# Patient Record
Sex: Female | Born: 1955 | Race: Black or African American | Hispanic: No | Smoking: Never smoker
Health system: Southern US, Community
[De-identification: ages and names within clinical notes are randomized; demographics above are authoritative.]

## PROBLEM LIST (undated history)

## (undated) DIAGNOSIS — F329 Major depressive disorder, single episode, unspecified: Secondary | ICD-10-CM

## (undated) DIAGNOSIS — E559 Vitamin D deficiency, unspecified: Secondary | ICD-10-CM

## (undated) DIAGNOSIS — I10 Essential (primary) hypertension: Secondary | ICD-10-CM

## (undated) DIAGNOSIS — B029 Zoster without complications: Secondary | ICD-10-CM

## (undated) DIAGNOSIS — N200 Calculus of kidney: Secondary | ICD-10-CM

## (undated) DIAGNOSIS — F32A Depression, unspecified: Secondary | ICD-10-CM

## (undated) DIAGNOSIS — E669 Obesity, unspecified: Secondary | ICD-10-CM

## (undated) HISTORY — PX: LITHOTRIPSY: SUR834

## (undated) HISTORY — DX: Zoster without complications: B02.9

## (undated) HISTORY — DX: Essential (primary) hypertension: I10

## (undated) HISTORY — DX: Obesity, unspecified: E66.9

## (undated) HISTORY — DX: Calculus of kidney: N20.0

## (undated) HISTORY — DX: Vitamin D deficiency, unspecified: E55.9

## (undated) HISTORY — DX: Major depressive disorder, single episode, unspecified: F32.9

## (undated) HISTORY — PX: EAR CYST EXCISION: SHX22

## (undated) HISTORY — PX: ABDOMINAL HYSTERECTOMY: SHX81

## (undated) HISTORY — DX: Depression, unspecified: F32.A

---

## 1998-09-26 ENCOUNTER — Other Ambulatory Visit: Admission: RE | Admit: 1998-09-26 | Discharge: 1998-09-26 | Payer: Self-pay | Admitting: *Deleted

## 1999-02-06 ENCOUNTER — Ambulatory Visit (HOSPITAL_COMMUNITY): Admission: RE | Admit: 1999-02-06 | Discharge: 1999-02-06 | Payer: Self-pay | Admitting: *Deleted

## 2000-04-30 ENCOUNTER — Ambulatory Visit (HOSPITAL_COMMUNITY): Admission: RE | Admit: 2000-04-30 | Discharge: 2000-04-30 | Payer: Self-pay | Admitting: *Deleted

## 2001-04-03 ENCOUNTER — Encounter: Payer: Self-pay | Admitting: Internal Medicine

## 2001-04-03 ENCOUNTER — Encounter: Admission: RE | Admit: 2001-04-03 | Discharge: 2001-04-03 | Payer: Self-pay | Admitting: Internal Medicine

## 2001-08-10 ENCOUNTER — Encounter: Payer: Self-pay | Admitting: Internal Medicine

## 2001-08-10 ENCOUNTER — Encounter: Admission: RE | Admit: 2001-08-10 | Discharge: 2001-08-10 | Payer: Self-pay | Admitting: Internal Medicine

## 2001-11-05 ENCOUNTER — Other Ambulatory Visit: Admission: RE | Admit: 2001-11-05 | Discharge: 2001-11-05 | Payer: Self-pay | Admitting: Internal Medicine

## 2002-04-07 ENCOUNTER — Encounter: Admission: RE | Admit: 2002-04-07 | Discharge: 2002-04-07 | Payer: Self-pay | Admitting: Internal Medicine

## 2002-04-07 ENCOUNTER — Encounter: Payer: Self-pay | Admitting: Internal Medicine

## 2002-11-05 ENCOUNTER — Other Ambulatory Visit: Admission: RE | Admit: 2002-11-05 | Discharge: 2002-11-05 | Payer: Self-pay | Admitting: Internal Medicine

## 2003-11-09 ENCOUNTER — Other Ambulatory Visit: Admission: RE | Admit: 2003-11-09 | Discharge: 2003-11-09 | Payer: Self-pay | Admitting: Internal Medicine

## 2003-11-15 ENCOUNTER — Ambulatory Visit (HOSPITAL_COMMUNITY): Admission: RE | Admit: 2003-11-15 | Discharge: 2003-11-15 | Payer: Self-pay | Admitting: Internal Medicine

## 2004-02-28 ENCOUNTER — Encounter (INDEPENDENT_AMBULATORY_CARE_PROVIDER_SITE_OTHER): Payer: Self-pay | Admitting: Specialist

## 2004-02-28 ENCOUNTER — Ambulatory Visit (HOSPITAL_COMMUNITY): Admission: RE | Admit: 2004-02-28 | Discharge: 2004-02-28 | Payer: Self-pay | Admitting: Obstetrics and Gynecology

## 2004-12-03 ENCOUNTER — Ambulatory Visit (HOSPITAL_COMMUNITY): Admission: RE | Admit: 2004-12-03 | Discharge: 2004-12-03 | Payer: Self-pay | Admitting: Internal Medicine

## 2005-01-23 ENCOUNTER — Other Ambulatory Visit: Admission: RE | Admit: 2005-01-23 | Discharge: 2005-01-23 | Payer: Self-pay | Admitting: Obstetrics and Gynecology

## 2006-10-02 ENCOUNTER — Other Ambulatory Visit: Admission: RE | Admit: 2006-10-02 | Discharge: 2006-10-02 | Payer: Self-pay | Admitting: Internal Medicine

## 2007-10-13 ENCOUNTER — Ambulatory Visit (HOSPITAL_COMMUNITY): Admission: RE | Admit: 2007-10-13 | Discharge: 2007-10-13 | Payer: Self-pay | Admitting: Internal Medicine

## 2008-04-12 ENCOUNTER — Ambulatory Visit (HOSPITAL_COMMUNITY): Admission: RE | Admit: 2008-04-12 | Discharge: 2008-04-12 | Payer: Self-pay | Admitting: Internal Medicine

## 2008-06-16 ENCOUNTER — Other Ambulatory Visit: Admission: RE | Admit: 2008-06-16 | Discharge: 2008-06-16 | Payer: Self-pay | Admitting: Obstetrics and Gynecology

## 2008-08-02 ENCOUNTER — Encounter: Payer: Self-pay | Admitting: Obstetrics and Gynecology

## 2008-08-02 ENCOUNTER — Ambulatory Visit (HOSPITAL_COMMUNITY): Admission: RE | Admit: 2008-08-02 | Discharge: 2008-08-02 | Payer: Self-pay | Admitting: Obstetrics and Gynecology

## 2009-10-03 ENCOUNTER — Encounter: Payer: Self-pay | Admitting: Obstetrics and Gynecology

## 2009-10-03 ENCOUNTER — Ambulatory Visit (HOSPITAL_COMMUNITY): Admission: RE | Admit: 2009-10-03 | Discharge: 2009-10-04 | Payer: Self-pay | Admitting: Obstetrics and Gynecology

## 2009-10-09 ENCOUNTER — Ambulatory Visit (HOSPITAL_COMMUNITY): Admission: RE | Admit: 2009-10-09 | Discharge: 2009-10-09 | Payer: Self-pay | Admitting: Obstetrics and Gynecology

## 2009-10-24 ENCOUNTER — Ambulatory Visit (HOSPITAL_COMMUNITY): Admission: RE | Admit: 2009-10-24 | Discharge: 2009-10-24 | Payer: Self-pay | Admitting: Internal Medicine

## 2009-11-07 IMAGING — RF DG CYSTOGRAM 3+V
12 series · 13 of 13 positions shown · non-contrast
Comparison: None.

CLINICAL DATA: 53-year-old female status post repair of the dome of
the bladder recently.  The patient presents with an indwelling
Foley catheter present since the surgery.

CYSTOGRAM
TECHNIQUE: After catheterization of the urinary bladder following
sterile technique the bladder was filled with 200 cc Cysto-Hypaque
30% by drip infusion.  Serial spot images were obtained during
bladder filling and post draining.
Fluoroscopy Time: 3.2 minutes

[Series 1: run · 1 of 1 slices shown (1 of 12)]
[im 1/1]
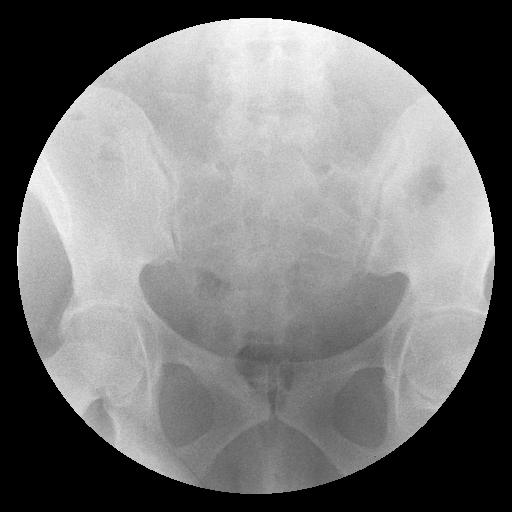

[Series 2: run · 1 of 1 slices shown (2 of 12)]
[im 1/1]
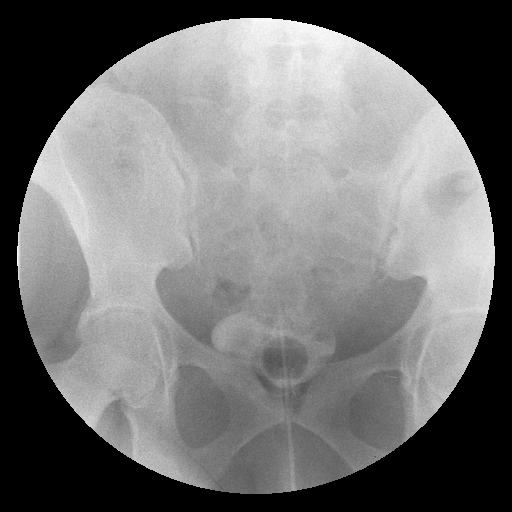

[Series 3: run · 1 of 1 slices shown (3 of 12)]
[im 1/1]
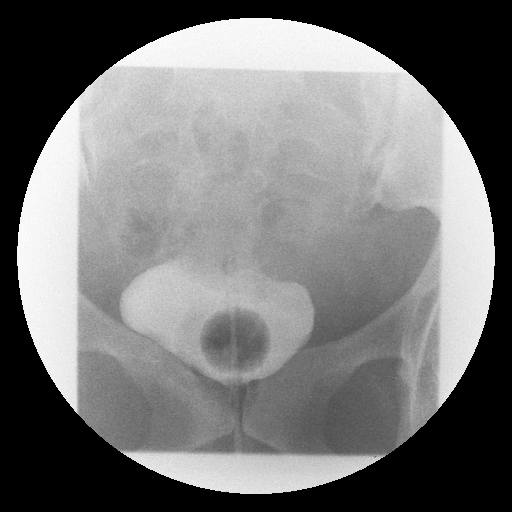

[Series 4: run · 1 of 1 slices shown (4 of 12)]
[im 1/1]
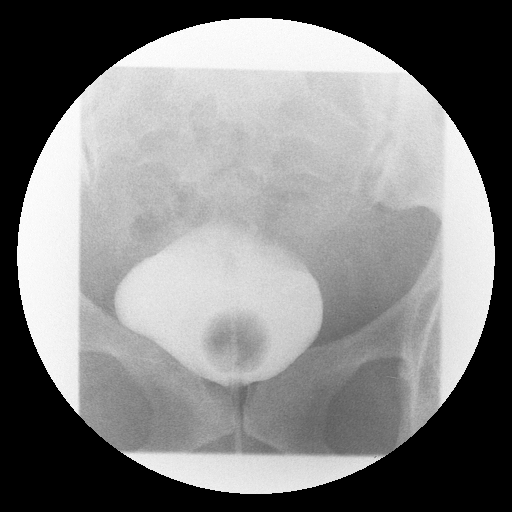

[Series 5: run · 1 of 1 slices shown (5 of 12)]
[im 1/1]
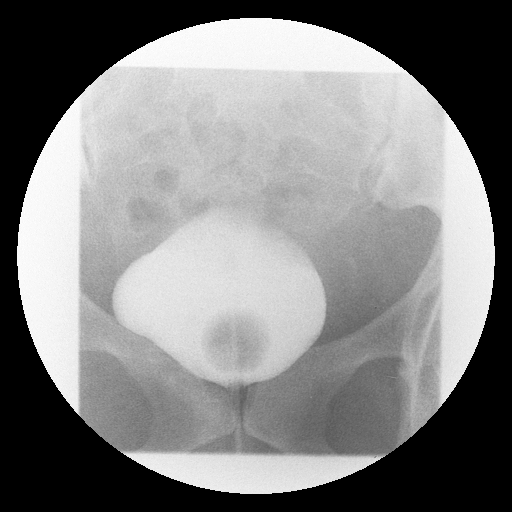

[Series 6: run · 1 of 1 slices shown (6 of 12)]
[im 1/1]
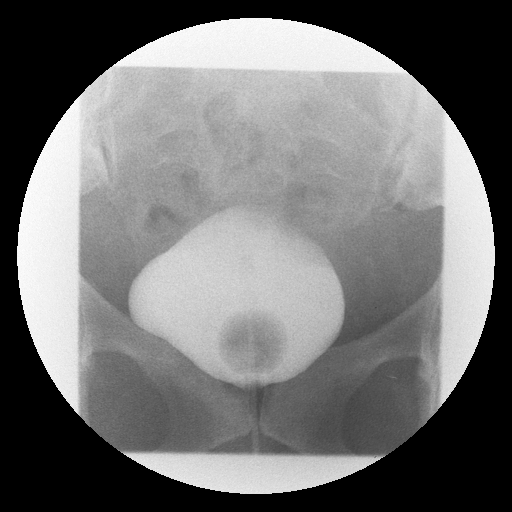

[Series 7: run · 1 of 1 slices shown (7 of 12)]
[im 1/1]
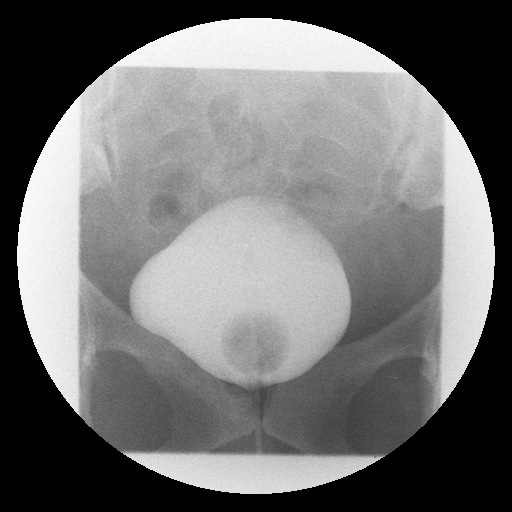

[Series 8: run · 1 of 1 slices shown (8 of 12)]
[im 1/1]
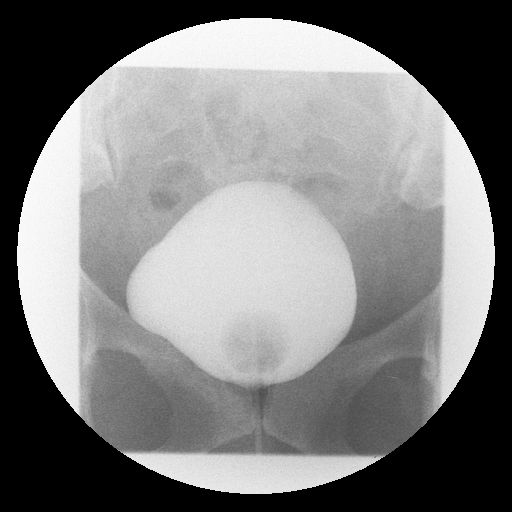

[Series 9: run · 2 of 2 slices shown (9 of 12)]
[im 1/2]
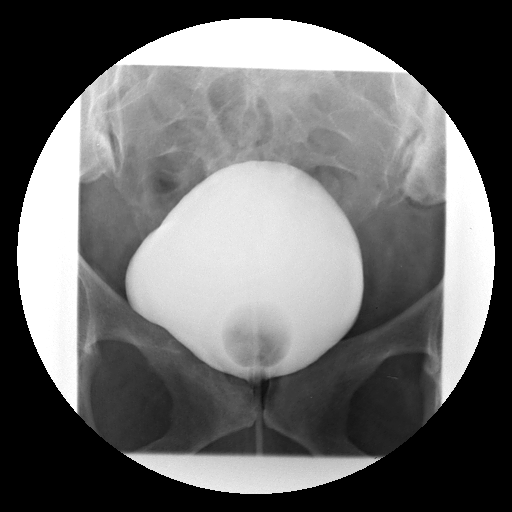
[im 2/2]
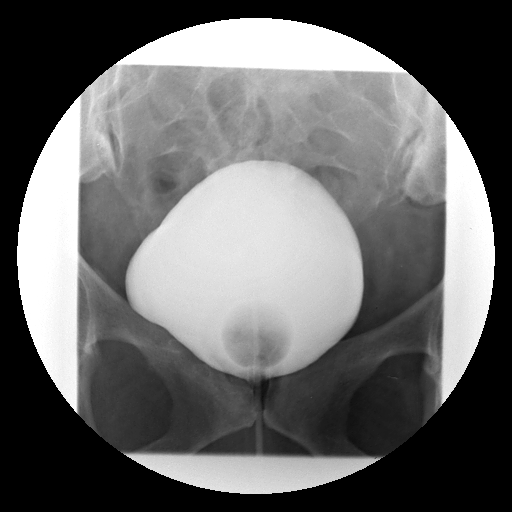

[Series 10: run · 1 of 1 slices shown (10 of 12)]
[im 1/1]
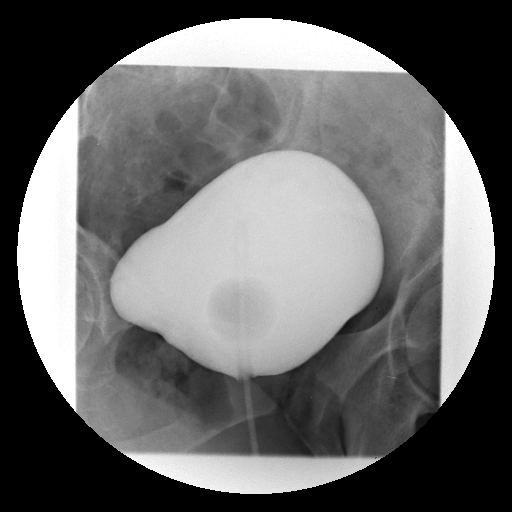

[Series 11: run · 1 of 1 slices shown (11 of 12)]
[im 1/1]
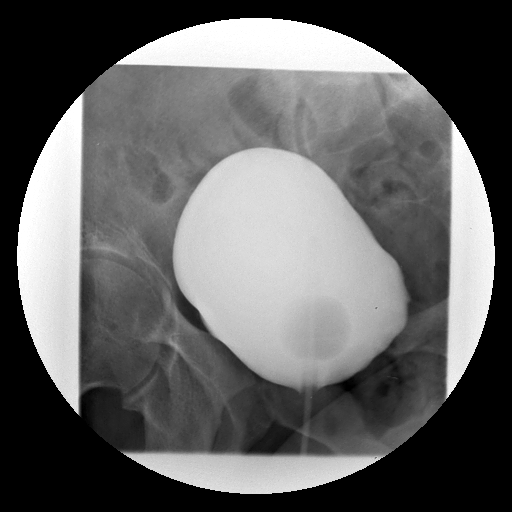

[Series 12: run · 1 of 1 slices shown (12 of 12)]
[im 1/1]
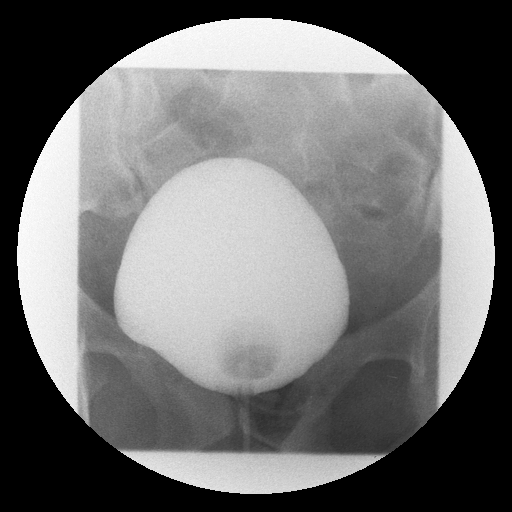

[13 of 13 positions shown; findings below may reference images not displayed]

FINDINGS: Preprocedural scout view of the pelvis is unremarkable.
Contrast was instilled into the patient's bladder via her existing
Foley catheter using gravity.  After 200 ml of contrast, fairly
good bladder distention was achieved and the patient was fairly
uncomfortable.  Bladder contour including the dome is within normal
limits throughout the exam.  No extravasation of contrast or
irregularity is identified.

Contrast was then drained via gravity through the Foley catheter.
There is minimal residual in the right lower bladder.  Again, no
evidence of extravasation.
IMPRESSION: Negative cystogram.

I discussed the above findings with Dr. Ynes Hartsock by telephone
at 1188 hours on 10/09/2009.

## 2011-04-04 LAB — COMPREHENSIVE METABOLIC PANEL
Albumin: 3.4 g/dL — ABNORMAL LOW (ref 3.5–5.2)
CO2: 32 mEq/L (ref 19–32)
Calcium: 9.4 mg/dL (ref 8.4–10.5)
Chloride: 104 mEq/L (ref 96–112)
GFR calc Af Amer: >60 mL/min (ref >60)
GFR calc non Af Amer: >60 mL/min (ref >60)
Potassium: 4 mEq/L (ref 3.5–5.1)
Sodium: 141 mEq/L (ref 135–145)

## 2011-04-04 LAB — CBC
HCT: 36.5 % (ref 36.0–46.0)
Hemoglobin: 11.2 g/dL — ABNORMAL LOW (ref 12.0–15.0)
MCHC: 33.1 g/dL (ref 30.0–36.0)
MCV: 82.5 fL (ref 78.0–100.0)
Platelets: 271 10*3/uL (ref 150–400)
Platelets: 305 10*3/uL (ref 150–400)
RBC: 4.04 MIL/uL (ref 3.87–5.11)
RBC: 4.38 MIL/uL (ref 3.87–5.11)
RDW: 13.7 % (ref 11.5–15.5)
RDW: 13.9 % (ref 11.5–15.5)
WBC: 11.4 10*3/uL — ABNORMAL HIGH (ref 4.0–10.5)
WBC: 13.6 10*3/uL — ABNORMAL HIGH (ref 4.0–10.5)

## 2011-04-04 LAB — TYPE AND SCREEN: Antibody Screen: NEGATIVE

## 2011-05-14 NOTE — Op Note (Signed)
Heather Harvey, Heather Harvey                 ACCOUNT NO.:  [id number]   MEDICAL RECORD NO.:  [id number]          PATIENT TYPE:  AMB   LOCATION:  [facility]                           FACILITY:  [facility]   PHYSICIAN:  Cynthia P. Romine, M.D.DATE OF BIRTH:  1956/05/20   DATE OF PROCEDURE:  08/02/2008  DATE OF DISCHARGE:                               OPERATIVE REPORT   PREOPERATIVE DIAGNOSES:  1. Metrorrhagia with a 1 x 1-cm sessile polyp on sonohysterogram.  2. History of simple hyperplasia.   POSTOPERATIVE DIAGNOSIS:  1. Metrorrhagia with a 1 x 1-cm sessile polyp on sonohysterogram.  2. History of simple hyperplasia.   PATHOLOGY:  Pending.   PROCEDURE:  Hysteroscopic resection of endometrial polyp and D&C.   SURGEON:  Cynthia P. Romine, MD   ANESTHESIA:  General by LMA.   ESTIMATED BLOOD LOSS:  Minimal.   COMPLICATIONS:  None.   SORBITOL DEFICIT:  75 mL.   PROCEDURE:  The patient was taken to the operating room and after the  induction of adequate general anesthesia, she was placed in a dorsal  lithotomy position and prepped and draped in usual fashion.  The bladder  was drained with a red rubber catheter.  Posterior weighted and anterior  Sims retractor were placed.  The cervix was grasped on its anterior lip  with a single-tooth tenaculum.  The cervix was noted to descend quite  significantly, in fact passed at the level of the introitus with  traction by the tenaculum.  The cervix was very easily dilated to a #31  Pratt with minimal resistance.  The resectoscope was introduced.  Sorbitol was used as a distention medium with the pressure set at 80  mmHg.  Hysteroscopy was carried out.  A small polyp at the fundus was  seen and some fluffy endometrium.  These were resected with a single  loop.  Photographic documentation was taken of the fundus and the  endometrial cavity.  The scope was withdrawn.  Gentle sharp curettage  was carried out.  The specimen was sent together to pathology.  Instruments removed from vagina and the procedure was terminated.  The  patient tolerated it well and went in satisfactory condition to  postanesthesia recovery.      Cynthia P. Romine, M.D.  Electronically Signed     CPR/MEDQ  D:  08/02/2008  T:  08/03/2008  Job:  [id number]

## 2011-05-17 NOTE — Op Note (Signed)
[name], Heather                           ACCOUNT NO.:  [id number]   MEDICAL RECORD NO.:  [id number]                   PATIENT TYPE:  AMB   LOCATION:  [facility]                                  FACILITY:  [facility]   PHYSICIAN:  Laqueta Linden, M.D.                 DATE OF BIRTH:  08/22/1956   DATE OF PROCEDURE:  02/28/2004  DATE OF DISCHARGE:                                 OPERATIVE REPORT   PREOPERATIVE DIAGNOSES:  1. Abnormal uterine bleeding due to endometrial polyp versus hyperplasia.  2. 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   POSTOPERATIVE DIAGNOSES:  1. Abnormal uterine bleeding due to endometrial polyp versus hyperplasia.  2. 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   PROCEDURES:  1. Hysteroscopic resection with curettage.  2. Right salpingo-oophorectomy, laparoscopic.   SURGEON:  Laqueta Linden, M.D.   ASSISTANT:  Assistant for procedure #2 was Edwena Felty. Romine, M.D.   ANESTHESIA:  General endotracheal anesthesia.   ESTIMATED BLOOD LOSS:  Less than 50 mL.   SPECIMENS:  Endometrial resection and curettings.  Right tube and ovary,  cytologic washings.   COUNTS:  Correct.   COMPLICATIONS:  None.   INDICATIONS FOR PROCEDURE:  Heather Harvey is a 55 year old nulligravid black  female with intramenstrual bleeding and a sessile polyp versus hyperplasia  or thickening on ultrasound and sonohistogram.  She had an incidental  finding of a 4 x 3.3 cm cyst involving the right ovary which was felt to  have classic radiologic findings for a dermoid.  She desires most  conservative outpatient procedure possible and therefore will undergo  combined hysteroscopic resection followed by laparoscopic right salpingo-  oophorectomy.  She has seen both informed consent films and voiced her  understanding and acceptance of all risks, benefits, and alternatives and  complications including, but not limited to anesthesia risks, infection,  bleeding possibly  requiring transfusion; injury to bowel, bladder, ureters,  vessels, nerves; possible need for laparotomy immediately or after surgery  for complications and possible need for an overnight or longer stay in the  hospital as well as risks of DVT, PE, pneumonia and other unnamed risks.  She has seen a film, voiced her understanding and acceptance of all risks  and agrees to proceed.  CA125 preoperatively was normal at 14.   DESCRIPTION OF PROCEDURE:  The patient was taken to the operating room and  after proper identification and consents are ascertained, she was placed on  the operating table in supine position.  After the induction of general  endotracheal anesthesia, she was placed in the Rutherford stirrups and abdomen,  perineum and vagina were prepped and draped in routine sterile fashion.  A  transurethral Foley was placed which was removed at the conclusion of the  procedure.  Bimanual examination confirmed a mid plane to retroverted,  slightly enlarged uterus which was mobile.  Speculum was  placed in the  vagina and the cervix grasped with a single-tooth tenaculum.  The uterine  cavity sounded to 8 cm.  the internal os was dilated to a #33 Pratt dilator.  Resectoscope was then inserted under direct vision.  The endocervical canal  was free of lesions.  Both tubal ostia were visualized.  There was a broad  based polyp on the posterior left wall of the uterine cavity.  There were no  other focal lesions.  The resectoscope was placed on routine setting and the  polyp was then resected using the single loop.  Specimens were sent  separately to pathology.  Several small bleeding points were cauterized.  The resectoscope was then removed.  Sharp curettage, productive of an  additional moderate amount of tissue was also then performed and the  specimen was sent separately to pathology.  All instruments were removed.  The tenaculum site was hemostatic. A Hulka tenaculum was then placed on the   posterior lip of the cervix and attention was then turned abdominally.  The  surgeon regowned and regloved  and a 2 cm infraumbilical incision was then  made.  A Veress needle was inserted into the peritoneal cavity with  intraperitoneal placement confirmed by saline drop and saline installation  tests.  Pneumoperitoneum was then established with CO2 with close monitoring  of pressures at approximately three liters.  The Veress needle  was removed.  The #10-11 disposable trocar was then inserted into the peritoneal cavity  without difficulty.  Inspection upon insertion of the laparoscope revealed  no obvious injury or bleeding at the insertion site.  The liver edge  appeared smooth.  Gallbladder was distended.  The appendix was not  visualized.  The patient was placed in Trendelenburg position and the uterus  elevated with a tenaculum.  The left tube and ovary appeared completely  normal.  The right ovary was enlarged to approximately 4 cm with a smooth  cyst which was consistent with a dermoid on ultrasound.  This was freely  mobile.  The uterus was freely mobile and the cul-de-sac was free of  lesions.  There was no evidence of adhesions or endometriosis noted.  There  were two additional ports placed, a 5 mm in the left lower quadrant under  direct vision and a 10 mm trochar in the right lower quadrant again under  direct vision after transillumination of vessels.  The adnexa were grasped  and retracted laterally and the tripolar cautery was then used to cauterize  and cut the proximal fallopian tube and utero-ovarian ligament with freeing  up of the right tube and ovary complex.  The ureter was visualized deep in  the pelvis well out of the operative field.  Once the infundibulopelvic  ligament had been isolated, 2-0 Vicryl Endoloops were then placed across the  infundibulopelvic ligament and the specimen was then excised from the blood supply.  An Endo sac was then placed through the  larger port and the tube  and ovary were dropped into this sac.  It was felt imperative to not disrupt  the sac and to remove the ovary intact to avoid peritoneal irritation from  spillage of cyst contents.  For this reason, prior to removing the sac, the  fascial incision of the right lower quadrant incision, the fascial incision  was then enlarged to approximately 3.5 cm under direct vision.  The sac with  intact ovary and cyst was then removed easily.  The fascial edges were  grasped  and the fascia was then closed in a running fashion using 0 Vicryl  suture.  The subcutaneous hemostasis was ascertained with cautery and the  skin was closed with interrupted subcuticular sutures of 4-0 Vicryl.  Inspection of the incision after replacing the pneumoperitoneum revealed  slight amount of bleeding from the peritoneal and preperitoneal fat edges.  This was cauterized with excellent hemostasis noted.  The pneumoperitoneum  was allowed to escape and several minutes elapsed.  It was then  reestablished and this incision site remained dry.  The pedicle remained  hemostatic.  There were no other lesions identified.  Of note, prior to  initiating the RSO, lactated Ringer's was instilled and aspirated and sent  in heparin for cytologic washings.  At this point, the 5 mm trocar was  withdrawn with excellent hemostasis noted.  Pneumoperitoneum was allowed to  escape and the central trocar was then removed.  There was some bleeding  from the umbilical incision which responded to continuous pressure while  closing the other incisions.  The other incision was closed with a  subcuticular suture of 4-0 Vicryl.  Steri-Strips were applied and the  incisions were injected with a total of 20 mL of 0.5% plain Marcaine for the  three incisions.  Pressure dressings were applied.  The umbilical incision  was observed after removal of pressure and remained hemostatic.  That  incision was then closed with Steri-Strips  and Marcaine applied.  Pressure  dressings were applied to all.  The patient received 30 mg of Toradol IV, 30  mg IM intraoperatively.  The Foley catheter and Hulka tenaculum were  removed.  She was stable and extubated on transfer to the recovery room.  She will be observed and discharged per anesthesia protocol.  She is to take  her routine medications, Advil or Aleve as needed, and given a  prescription for Percocet 5/325, dispense 20 one to two q.4-6h. p.r.n. pain  with no refills.  She is to follow up in the office in two to three weeks'  time or sooner for excessive pain, fever, bleeding or other concerns.  She  was given routine verbal and written discharge instructions.                                               Laqueta Linden, M.D.    LKS/MEDQ  D:  02/28/2004  T:  02/28/2004  Job:  [id number]   cc:   Lovenia Kim, D.O.  [address]  [place]  [place] [address]  Fax: [phone]

## 2011-08-20 ENCOUNTER — Ambulatory Visit
Admission: RE | Admit: 2011-08-20 | Discharge: 2011-08-20 | Disposition: A | Discharge status: Home / Self Care | Payer: Managed Care, Other (non HMO) | Source: Ambulatory Visit | Attending: Internal Medicine | Admitting: Internal Medicine

## 2011-08-20 ENCOUNTER — Other Ambulatory Visit: Payer: Self-pay | Admitting: Internal Medicine

## 2011-08-20 DIAGNOSIS — R059 Cough, unspecified: Secondary | ICD-10-CM

## 2011-08-20 DIAGNOSIS — R05 Cough: Secondary | ICD-10-CM

## 2011-09-27 LAB — COMPREHENSIVE METABOLIC PANEL
ALT: 16
AST: 19
Albumin: 3.4 — ABNORMAL LOW
BUN: 8
Calcium: 9.4
Chloride: 103
Creatinine, Ser: 0.79
Total Bilirubin: 0.6
Total Protein: 7

## 2011-09-27 LAB — CBC: MCHC: 33.1

## 2011-09-27 LAB — HCG, SERUM, QUALITATIVE: Preg, Serum: NEGATIVE

## 2011-11-30 DIAGNOSIS — N200 Calculus of kidney: Secondary | ICD-10-CM

## 2011-11-30 HISTORY — DX: Calculus of kidney: N20.0

## 2012-01-08 ENCOUNTER — Ambulatory Visit (HOSPITAL_COMMUNITY)
Admission: RE | Admit: 2012-01-08 | Discharge: 2012-01-08 | Disposition: A | Discharge status: Home / Self Care | Payer: Managed Care, Other (non HMO) | Source: Ambulatory Visit | Attending: Internal Medicine | Admitting: Internal Medicine

## 2012-01-08 ENCOUNTER — Other Ambulatory Visit (HOSPITAL_COMMUNITY): Payer: Self-pay | Admitting: Internal Medicine

## 2012-01-08 DIAGNOSIS — R05 Cough: Secondary | ICD-10-CM | POA: Insufficient documentation

## 2012-01-08 DIAGNOSIS — R059 Cough, unspecified: Secondary | ICD-10-CM

## 2012-01-08 DIAGNOSIS — R0602 Shortness of breath: Secondary | ICD-10-CM | POA: Insufficient documentation

## 2012-07-15 ENCOUNTER — Other Ambulatory Visit (HOSPITAL_COMMUNITY): Payer: Self-pay | Admitting: Emergency Medicine

## 2012-07-15 DIAGNOSIS — Z1231 Encounter for screening mammogram for malignant neoplasm of breast: Secondary | ICD-10-CM

## 2012-08-17 ENCOUNTER — Ambulatory Visit (HOSPITAL_COMMUNITY)
Admission: RE | Admit: 2012-08-17 | Discharge: 2012-08-17 | Disposition: A | Discharge status: Home / Self Care | Payer: Managed Care, Other (non HMO) | Source: Ambulatory Visit | Attending: Emergency Medicine | Admitting: Emergency Medicine

## 2012-08-17 DIAGNOSIS — Z1231 Encounter for screening mammogram for malignant neoplasm of breast: Secondary | ICD-10-CM | POA: Insufficient documentation

## 2013-03-31 ENCOUNTER — Encounter: Payer: Self-pay | Admitting: Internal Medicine

## 2013-05-26 ENCOUNTER — Encounter: Payer: Managed Care, Other (non HMO) | Admitting: Internal Medicine

## 2013-12-27 ENCOUNTER — Other Ambulatory Visit: Payer: Self-pay | Admitting: Physician Assistant

## 2013-12-27 MED ORDER — TRIAMTERENE-HCTZ 37.5-25 MG PO TABS: 1.0000 | ORAL_TABLET | Freq: Every day | ORAL | Status: DC

## 2014-02-09 ENCOUNTER — Ambulatory Visit: Payer: Self-pay | Admitting: Emergency Medicine

## 2014-02-10 ENCOUNTER — Ambulatory Visit
Admission: RE | Admit: 2014-02-10 | Discharge: 2014-02-10 | Disposition: A | Discharge status: Home / Self Care | Payer: Managed Care, Other (non HMO) | Source: Ambulatory Visit | Attending: Emergency Medicine | Admitting: Emergency Medicine

## 2014-02-10 ENCOUNTER — Encounter: Payer: Self-pay | Admitting: Emergency Medicine

## 2014-02-10 ENCOUNTER — Ambulatory Visit (INDEPENDENT_AMBULATORY_CARE_PROVIDER_SITE_OTHER): Payer: Managed Care, Other (non HMO) | Admitting: Emergency Medicine

## 2014-02-10 VITALS — BP 126/84 | HR 68 | Temp 98.2°F | Resp 18 | Ht 67.25 in | Wt 215.0 lb

## 2014-02-10 DIAGNOSIS — R5381 Other malaise: Secondary | ICD-10-CM

## 2014-02-10 DIAGNOSIS — F329 Major depressive disorder, single episode, unspecified: Secondary | ICD-10-CM

## 2014-02-10 DIAGNOSIS — M25511 Pain in right shoulder: Secondary | ICD-10-CM

## 2014-02-10 DIAGNOSIS — R7309 Other abnormal glucose: Secondary | ICD-10-CM

## 2014-02-10 DIAGNOSIS — F32A Depression, unspecified: Secondary | ICD-10-CM

## 2014-02-10 DIAGNOSIS — D649 Anemia, unspecified: Secondary | ICD-10-CM

## 2014-02-10 DIAGNOSIS — Z79899 Other long term (current) drug therapy: Secondary | ICD-10-CM

## 2014-02-10 DIAGNOSIS — I10 Essential (primary) hypertension: Secondary | ICD-10-CM

## 2014-02-10 DIAGNOSIS — M542 Cervicalgia: Secondary | ICD-10-CM

## 2014-02-10 DIAGNOSIS — E559 Vitamin D deficiency, unspecified: Secondary | ICD-10-CM

## 2014-02-10 DIAGNOSIS — R5383 Other fatigue: Secondary | ICD-10-CM

## 2014-02-10 MED ORDER — PREDNISONE 10 MG PO TABS: ORAL_TABLET | ORAL | Status: DC

## 2014-02-10 NOTE — Progress Notes (Signed)
Subjective:    Patient ID: Heather Harvey, female    DOB: 1956/09/24, 58 y.o.   MRN: [id number]  HPI Comments: [age] yo female presents for 3 month F/U for HTN, Pre-Dm, D. Deficient. She has been trying to improve diet. She is exercising routinely. She is trying to lose weight. LAST LABS were WNL except BS only mildly elevated. Her BP is good at home.  She has been mildly fatigued but notes she has been very busy with work. She feels she is sleeping well and fatigue resolves with rest.   She has increased discomfort in neck and right arm x 10 days. No recall of injury or strain. She has been exercising routinely but no new exercises. She notes she feels throbbing ache, denies numbness. She notes increased sharp pain with sudden movements. She does note she has had more computer use at work. She notes her bra makes symptoms worse. She is concerned her large breasts are chronically straining her neck and shoulders. She notes lying flat helps with the pain/ pulling sensation due to taking away the weight of her breasts. She wants to consider breast reduction surgery. She has tried better bras with wider padded straps without any relief with symptoms. She notes she has had shoulder indentions from bra support for years.  Shoulder Pain   Back Pain  Neck Pain    Current Outpatient Prescriptions on File Prior to Visit  Medication Sig Dispense Refill  . Magnesium 500 MG CAPS Take 1 capsule by mouth daily.      . metformin (FORTAMET) 500 MG (OSM) 24 hr tablet Take 500 mg by mouth daily with breakfast.      . Multiple Vitamin (MULTIVITAMIN) tablet Take 1 tablet by mouth daily.      Marland Kitchen. triamterene-hydrochlorothiazide (MAXZIDE-25) 37.5-25 MG per tablet Take 1 tablet by mouth daily.  90 tablet  0   No current facility-administered medications on file prior to visit.   No Known Allergies Past Medical History  Diagnosis Date  . Hypertension   . Depression   . Shingles     chest-right  . Obesity   .  Kidney stones 11-2011  . Vitamin D deficiency        Review of Systems  Constitutional: Positive for fatigue.  Musculoskeletal: Positive for arthralgias, back pain, joint swelling and neck pain.  All other systems reviewed and are negative.   BP 126/84  Pulse 68  Temp(Src) 98.2 F (36.8 C) (Temporal)  Resp 18  Ht 5' 7.25" (1.708 m)  Wt 215 lb (97.523 kg)  BMI 33.43 kg/m2     Objective:   Physical Exam  Nursing note and vitals reviewed. Constitutional: She is oriented to person, place, and time. She appears well-developed and well-nourished. No distress.  HENT:  Head: Normocephalic and atraumatic.  Right Ear: External ear normal.  Left Ear: External ear normal.  Nose: Nose normal.  Mouth/Throat: Oropharynx is clear and moist.  Eyes: Conjunctivae and EOM are normal.  Neck: Normal range of motion. Neck supple. No JVD present. No thyromegaly present.  Cardiovascular: Normal rate, regular rhythm, normal heart sounds and intact distal pulses.   Pulmonary/Chest: Effort normal and breath sounds normal.  Abdominal: Soft. Bowel sounds are normal. She exhibits no distension and no mass. There is no tenderness. There is no rebound and no guarding.  Genitourinary:  LARGE Breasts, with good support with wide padded straps, wearing right strap off of shoulder due to pain  Musculoskeletal: Normal range of motion.  She exhibits no edema and no tenderness.  Both shoulders with almost 1 inch indention from bra support of large breasts. + pain with ROM right Shoulder and neck + decreased strength R>L shoulder  Lymphadenopathy:    She has no cervical adenopathy.  Neurological: She is alert and oriented to person, place, and time. No cranial nerve deficit.  Skin: Skin is warm and dry. No rash noted. No erythema. No pallor.  Psychiatric: She has a normal mood and affect. Her behavior is normal. Judgment and thought content normal.          Assessment & Plan:  1.  3 month F/U for HTN,  Pre-Dm, D. Deficient. Needs healthy diet, cardio QD and obtain healthy weight. Check Labs, Check BP if >130/80 call office 2. Fatigue- check labs, increase activity and H2O 3. Right shoulder/ neck pain- check labs, get xrays, heat stretch ice. Concern that Breast size may be contributing to pain. Patient wants to consider having breast reduction. ADvised she can schedule OV with plastic surgery for further evaluation.

## 2014-02-10 NOTE — Patient Instructions (Signed)

## 2014-02-11 ENCOUNTER — Other Ambulatory Visit: Payer: Self-pay | Admitting: Emergency Medicine

## 2014-02-11 DIAGNOSIS — F325 Major depressive disorder, single episode, in full remission: Secondary | ICD-10-CM | POA: Insufficient documentation

## 2014-02-11 DIAGNOSIS — E559 Vitamin D deficiency, unspecified: Secondary | ICD-10-CM | POA: Insufficient documentation

## 2014-02-11 DIAGNOSIS — I10 Essential (primary) hypertension: Secondary | ICD-10-CM | POA: Insufficient documentation

## 2014-02-11 LAB — BASIC METABOLIC PANEL WITH GFR
BUN: 18 mg/dL (ref 6–23)
CO2: 30 meq/L (ref 19–32)
CREATININE: 0.86 mg/dL (ref 0.50–1.10)
Calcium: 9.8 mg/dL (ref 8.4–10.5)
Chloride: 103 mEq/L (ref 96–112)
GFR, Est African American: 87 mL/min
GFR, Est Non African American: 75 mL/min
GLUCOSE: 75 mg/dL (ref 70–99)
Potassium: 3.9 mEq/L (ref 3.5–5.3)
SODIUM: 141 meq/L (ref 135–145)

## 2014-02-11 LAB — CBC WITH DIFFERENTIAL/PLATELET
BASOS ABS: 0 10*3/uL (ref 0.0–0.1)
BASOS PCT: 0 % (ref 0–1)
EOS ABS: 0.2 10*3/uL (ref 0.0–0.7)
EOS PCT: 3 % (ref 0–5)
HCT: 38.2 % (ref 36.0–46.0)
HEMOGLOBIN: 12.9 g/dL (ref 12.0–15.0)
LYMPHS PCT: 45 % (ref 12–46)
Lymphs Abs: 3.9 10*3/uL (ref 0.7–4.0)
MCH: 27.2 pg (ref 26.0–34.0)
MCHC: 33.8 g/dL (ref 30.0–36.0)
MCV: 80.6 fL (ref 78.0–100.0)
MONO ABS: 0.7 10*3/uL (ref 0.1–1.0)
MONOS PCT: 8 % (ref 3–12)
NEUTROS PCT: 44 % (ref 43–77)
Neutro Abs: 3.9 10*3/uL (ref 1.7–7.7)
PLATELETS: 306 10*3/uL (ref 150–400)
RBC: 4.74 MIL/uL (ref 3.87–5.11)
RDW: 14.5 % (ref 11.5–15.5)
WBC: 8.7 10*3/uL (ref 4.0–10.5)

## 2014-02-11 LAB — VITAMIN D 25 HYDROXY (VIT D DEFICIENCY, FRACTURES): Vit D, 25-Hydroxy: 42 ng/mL (ref 30–89)

## 2014-02-11 LAB — TSH: TSH: 2.572 u[IU]/mL (ref 0.350–4.500)

## 2014-02-11 LAB — VITAMIN B12: Vitamin B-12: 458 pg/mL (ref 211–911)

## 2014-02-11 LAB — HEMOGLOBIN A1C
Hgb A1c MFr Bld: 5.4 % (ref <5.7)
Mean Plasma Glucose: 108 mg/dL (ref <117)

## 2014-02-11 LAB — MAGNESIUM: MAGNESIUM: 2.1 mg/dL (ref 1.5–2.5)

## 2014-02-11 LAB — IRON AND TIBC
%SAT: 16 % — AB (ref 20–55)
IRON: 57 ug/dL (ref 42–145)
TIBC: 353 ug/dL (ref 250–470)
UIBC: 296 ug/dL (ref 125–400)

## 2014-02-11 LAB — FOLATE RBC: RBC Folate: 605 ng/mL (ref >280)

## 2014-02-11 LAB — INSULIN, FASTING: INSULIN FASTING, SERUM: 24 u[IU]/mL (ref 3–28)

## 2014-02-11 MED ORDER — CYCLOBENZAPRINE HCL 10 MG PO TABS: 10.0000 mg | ORAL_TABLET | Freq: Three times a day (TID) | ORAL | Status: DC | PRN

## 2014-02-21 ENCOUNTER — Telehealth: Payer: Self-pay | Admitting: *Deleted

## 2014-02-21 NOTE — Telephone Encounter (Signed)
Pt has finished medication for arm pain & has had no improvement asking for advise on what should be done next?   #2 = pt said her boss told her she might consider getting a order faxed to her for the Team that handles a eval, of her work station to see if there is a reason that maybe her work station is causing the problem? She said they eval everything like her chair the height of the chair ect.  If ok send order to Faylene Kurtzanesha Smith fax# [phone] [facility] [place]

## 2014-02-23 ENCOUNTER — Other Ambulatory Visit: Payer: Self-pay | Admitting: *Deleted

## 2014-02-23 DIAGNOSIS — M25529 Pain in unspecified elbow: Secondary | ICD-10-CM

## 2014-02-23 NOTE — Telephone Encounter (Signed)
Pt calling because shoulder is not better please advise

## 2014-04-06 ENCOUNTER — Emergency Department (HOSPITAL_BASED_OUTPATIENT_CLINIC_OR_DEPARTMENT_OTHER): Payer: Managed Care, Other (non HMO)

## 2014-04-06 ENCOUNTER — Encounter (HOSPITAL_BASED_OUTPATIENT_CLINIC_OR_DEPARTMENT_OTHER): Payer: Self-pay | Admitting: Emergency Medicine

## 2014-04-06 ENCOUNTER — Emergency Department (HOSPITAL_BASED_OUTPATIENT_CLINIC_OR_DEPARTMENT_OTHER)
Admission: EM | Admit: 2014-04-06 | Discharge: 2014-04-06 | Disposition: A | Discharge status: Home / Self Care | Payer: Managed Care, Other (non HMO) | Attending: Emergency Medicine | Admitting: Emergency Medicine

## 2014-04-06 DIAGNOSIS — R112 Nausea with vomiting, unspecified: Secondary | ICD-10-CM | POA: Insufficient documentation

## 2014-04-06 DIAGNOSIS — Z79899 Other long term (current) drug therapy: Secondary | ICD-10-CM | POA: Insufficient documentation

## 2014-04-06 DIAGNOSIS — Z8659 Personal history of other mental and behavioral disorders: Secondary | ICD-10-CM | POA: Insufficient documentation

## 2014-04-06 DIAGNOSIS — Z8619 Personal history of other infectious and parasitic diseases: Secondary | ICD-10-CM | POA: Insufficient documentation

## 2014-04-06 DIAGNOSIS — Z9089 Acquired absence of other organs: Secondary | ICD-10-CM | POA: Insufficient documentation

## 2014-04-06 DIAGNOSIS — E669 Obesity, unspecified: Secondary | ICD-10-CM | POA: Insufficient documentation

## 2014-04-06 DIAGNOSIS — I10 Essential (primary) hypertension: Secondary | ICD-10-CM | POA: Insufficient documentation

## 2014-04-06 DIAGNOSIS — N200 Calculus of kidney: Secondary | ICD-10-CM

## 2014-04-06 LAB — URINE MICROSCOPIC-ADD ON

## 2014-04-06 LAB — URINALYSIS, ROUTINE W REFLEX MICROSCOPIC
BILIRUBIN URINE: NEGATIVE
Glucose, UA: NEGATIVE mg/dL
KETONES UR: NEGATIVE mg/dL
Leukocytes, UA: NEGATIVE
NITRITE: NEGATIVE
PH: 6 (ref 5.0–8.0)
Protein, ur: NEGATIVE mg/dL
Specific Gravity, Urine: 1.017 (ref 1.005–1.030)
Urobilinogen, UA: 0.2 mg/dL (ref 0.0–1.0)

## 2014-04-06 MED ORDER — ONDANSETRON HCL 4 MG/2ML IJ SOLN
4.0000 mg | Freq: Once | INTRAMUSCULAR | Status: AC
  Administered 2014-04-06: 4 mg via INTRAVENOUS
  Filled 2014-04-06: qty 2

## 2014-04-06 MED ORDER — ONDANSETRON HCL 4 MG/2ML IJ SOLN
4.0000 mg | Freq: Once | INTRAMUSCULAR | Status: DC
  Filled 2014-04-06: qty 2

## 2014-04-06 MED ORDER — ONDANSETRON HCL 4 MG/2ML IJ SOLN
4.0000 mg | Freq: Once | INTRAMUSCULAR | Status: AC
  Administered 2014-04-06: 4 mg via INTRAVENOUS

## 2014-04-06 MED ORDER — HYDROMORPHONE HCL PF 1 MG/ML IJ SOLN
1.0000 mg | Freq: Once | INTRAMUSCULAR | Status: AC
  Administered 2014-04-06: 1 mg via INTRAVENOUS
  Filled 2014-04-06: qty 1

## 2014-04-06 NOTE — ED Notes (Signed)
Left flank pain that started last night with urinary frequency worsening today.

## 2014-04-06 NOTE — ED Provider Notes (Signed)
CSN: [id number]     Arrival date & time 04/06/14  1120 History   First MD Initiated Contact with Patient 04/06/14 1205     Chief Complaint  Patient presents with  . Flank Pain     (Consider location/radiation/quality/duration/timing/severity/associated sxs/prior Treatment) Patient is a 58 y.o. female presenting with flank pain. The history is provided by the patient. No language interpreter was used.  Flank Pain This is a new problem. The current episode started today. The problem occurs constantly. The problem has been gradually worsening. Associated symptoms include nausea and vomiting. Nothing aggravates the symptoms. She has tried nothing for the symptoms. The treatment provided no relief.  Pt reports she has a history of shingles.    Past Medical History  Diagnosis Date  . Hypertension   . Depression   . Shingles     chest-right  . Obesity   . Kidney stones 11-2011  . Vitamin D deficiency    Past Surgical History  Procedure Laterality Date  . Lithotripsy    . Ear cyst excision Left   . Abdominal hysterectomy      partial   Family History  Problem Relation Age of Onset  . Hypertension Mother   . Cancer Mother     breast  . Stroke Father   . Diabetes Father   . Hyperlipidemia Father    History  Substance Use Topics  . Smoking status: Passive Smoke Exposure - Never Smoker  . Smokeless tobacco: Not on file  . Alcohol Use: No   OB History   Grav Para Term Preterm Abortions TAB SAB Ect Mult Living                 Review of Systems  Gastrointestinal: Positive for nausea and vomiting.  Genitourinary: Positive for flank pain.  All other systems reviewed and are negative.     Allergies  Review of patient's allergies indicates no known allergies.  Home Medications   Current Outpatient Rx  Name  Route  Sig  Dispense  Refill  . cyclobenzaprine (FLEXERIL) 10 MG tablet   Oral   Take 1 tablet (10 mg total) by mouth every 8 (eight) hours as needed for muscle  spasms. Take 1/2 to 1 by mouth as needed for muscle spams every 8 hours.   30 tablet   1   . Magnesium 500 MG CAPS   Oral   Take 1 capsule by mouth daily.         . metformin (FORTAMET) 500 MG (OSM) 24 hr tablet   Oral   Take 500 mg by mouth daily with breakfast.         . Multiple Vitamin (MULTIVITAMIN) tablet   Oral   Take 1 tablet by mouth daily.         . predniSONE (DELTASONE) 10 MG tablet      1 po TID x 3 days, 1 PO BID x 3 days, 1 po QD x 5 days   20 tablet   0   . triamterene-hydrochlorothiazide (MAXZIDE-25) 37.5-25 MG per tablet   Oral   Take 1 tablet by mouth daily.   90 tablet   0    BP 196/113  Pulse 84  Temp(Src) 97.7 F (36.5 C)  Resp 24  SpO2 100% Physical Exam  Nursing note and vitals reviewed. Constitutional: She is oriented to person, place, and time. She appears well-developed and well-nourished.  HENT:  Head: Normocephalic and atraumatic.  Eyes: EOM are normal. Pupils are equal,  round, and reactive to light.  Neck: Normal range of motion.  Cardiovascular: Normal rate and normal heart sounds.   Pulmonary/Chest: Effort normal.  Abdominal: She exhibits no distension.  Musculoskeletal: Normal range of motion.  Neurological: She is alert and oriented to person, place, and time.  Skin: Skin is warm.  Psychiatric: She has a normal mood and affect.    ED Course  Procedures (including critical care time) Labs Review Labs Reviewed  URINALYSIS, ROUTINE W REFLEX MICROSCOPIC - Abnormal; Notable for the following:    Hgb urine dipstick MODERATE (*)    All other components within normal limits  URINE MICROSCOPIC-ADD ON   Imaging Review Ct Abdomen Pelvis Wo Contrast  04/06/2014   CLINICAL DATA:  Left flank pain and nausea with history of kidney stones.  EXAM: CT ABDOMEN AND PELVIS WITHOUT CONTRAST  TECHNIQUE: Multidetector CT imaging of the abdomen and pelvis was performed following the standard protocol without intravenous contrast.   COMPARISON:  None.  FINDINGS: Lung bases show minimal dependent atelectasis bilaterally. Heart size normal. No pericardial or pleural effusion.  1.6 cm low-attenuation lesion in left hepatic lobe is difficult to definitively characterize without IV contrast but in the absence of known malignancy, cysts is likely. Liver, gallbladder and adrenal glands are otherwise unremarkable. Stones are seen in the kidneys bilaterally. Left renal edema and mild to moderate left hydronephrosis secondary to an 8 mm stone at the left ureteral pelvic junction. Left ureter is decompressed. Spleen, pancreas, stomach and bowel are unremarkable with exception of a possible tiny hiatal hernia. Hysterectomy. Ovaries are visualized. 10 mm left external iliac lymph node, nonspecific. No free fluid. No worrisome lytic or sclerotic lesions. Degenerative changes are seen in the spine.  IMPRESSION: 1. Mild to moderately obstructing 8 mm left ureteral pelvic junction stone. 2. Bilateral renal stones.   Electronically Signed   By: Leanna BattlesMelinda  Blietz M.D.   On: 04/06/2014 13:04     EKG Interpretation None      MDM   Final diagnoses:  Kidney stone    Urine mod blood,   Pt reports some pain relief with dilaudid and zofran.   Pt has a 8mm left upj stone.   I spoke to Dr. Michel Bickersnewsome's office. ([place]   Dr. Adriana Simasook will see pt at 3pm and evaluate.  Pt given 2nd dosage of pain medications.   Ct disc sent with pt.     Lonia SkinnerLeslie K IonaSofia, PA-C 04/06/14 1444

## 2014-04-06 NOTE — Discharge Instructions (Signed)
Kidney Stones  Kidney stones (urolithiasis) are deposits that form inside your kidneys. The intense pain is caused by the stone moving through the urinary tract. When the stone moves, the ureter goes into spasm around the stone. The stone is usually passed in the urine.   CAUSES   · A disorder that makes certain neck glands produce too much parathyroid hormone (primary hyperparathyroidism).  · A buildup of uric acid crystals, similar to gout in your joints.  · Narrowing (stricture) of the ureter.  · A kidney obstruction present at birth (congenital obstruction).  · Previous surgery on the kidney or ureters.  · Numerous kidney infections.  SYMPTOMS   · Feeling sick to your stomach (nauseous).  · Throwing up (vomiting).  · Blood in the urine (hematuria).  · Pain that usually spreads (radiates) to the groin.  · Frequency or urgency of urination.  DIAGNOSIS   · Taking a history and physical exam.  · Blood or urine tests.  · CT scan.  · Occasionally, an examination of the inside of the urinary bladder (cystoscopy) is performed.  TREATMENT   · Observation.  · Increasing your fluid intake.  · Extracorporeal shock wave lithotripsy This is a noninvasive procedure that uses shock waves to break up kidney stones.  · Surgery may be needed if you have severe pain or persistent obstruction. There are various surgical procedures. Most of the procedures are performed with the use of small instruments. Only small incisions are needed to accommodate these instruments, so recovery time is minimized.  The size, location, and chemical composition are all important variables that will determine the proper choice of action for you. Talk to your health care provider to better understand your situation so that you will minimize the risk of injury to yourself and your kidney.   HOME CARE INSTRUCTIONS   · Drink enough water and fluids to keep your urine clear or pale yellow. This will help you to pass the stone or stone fragments.  · Strain  all urine through the provided strainer. Keep all particulate matter and stones for your health care provider to see. The stone causing the pain may be as small as a grain of salt. It is very important to use the strainer each and every time you pass your urine. The collection of your stone will allow your health care provider to analyze it and verify that a stone has actually passed. The stone analysis will often identify what you can do to reduce the incidence of recurrences.  · Only take over-the-counter or prescription medicines for pain, discomfort, or fever as directed by your health care provider.  · Make a follow-up appointment with your health care provider as directed.  · Get follow-up X-rays if required. The absence of pain does not always mean that the stone has passed. It may have only stopped moving. If the urine remains completely obstructed, it can cause loss of kidney function or even complete destruction of the kidney. It is your responsibility to make sure X-rays and follow-ups are completed. Ultrasounds of the kidney can show blockages and the status of the kidney. Ultrasounds are not associated with any radiation and can be performed easily in a matter of minutes.  SEEK MEDICAL CARE IF:  · You experience pain that is progressive and unresponsive to any pain medicine you have been prescribed.  SEEK IMMEDIATE MEDICAL CARE IF:   · Pain cannot be controlled with the prescribed medicine.  · You have a fever   or shaking chills.  · The severity or intensity of pain increases over 18 hours and is not relieved by pain medicine.  · You develop a new onset of abdominal pain.  · You feel faint or pass out.  · You are unable to urinate.  MAKE SURE YOU:   · Understand these instructions.  · Will watch your condition.  · Will get help right away if you are not doing well or get worse.  Document Released: 12/16/2005 Document Revised: 08/18/2013 Document Reviewed: 05/19/2013  [employer]® Patient Information ©2014  [employer].

## 2014-04-08 DIAGNOSIS — N201 Calculus of ureter: Secondary | ICD-10-CM | POA: Insufficient documentation

## 2014-04-08 NOTE — ED Provider Notes (Signed)
Medical screening examination/treatment/procedure(s) were performed by non-physician practitioner and as supervising physician I was immediately available for consultation/collaboration.   EKG Interpretation None        Rogue Pautler B. Laverne Klugh, MD 04/08/14 0739 

## 2014-07-19 ENCOUNTER — Encounter: Payer: Self-pay | Admitting: Emergency Medicine

## 2014-08-17 ENCOUNTER — Encounter: Payer: Self-pay | Admitting: Physician Assistant

## 2014-08-17 ENCOUNTER — Ambulatory Visit (INDEPENDENT_AMBULATORY_CARE_PROVIDER_SITE_OTHER): Payer: Managed Care, Other (non HMO) | Admitting: Physician Assistant

## 2014-08-17 VITALS — BP 138/92 | HR 86 | Temp 98.4°F | Resp 16 | Ht 67.0 in | Wt 224.0 lb

## 2014-08-17 DIAGNOSIS — E559 Vitamin D deficiency, unspecified: Secondary | ICD-10-CM

## 2014-08-17 DIAGNOSIS — Z0000 Encounter for general adult medical examination without abnormal findings: Secondary | ICD-10-CM

## 2014-08-17 DIAGNOSIS — F329 Major depressive disorder, single episode, unspecified: Secondary | ICD-10-CM

## 2014-08-17 DIAGNOSIS — M542 Cervicalgia: Secondary | ICD-10-CM

## 2014-08-17 DIAGNOSIS — Z79899 Other long term (current) drug therapy: Secondary | ICD-10-CM

## 2014-08-17 DIAGNOSIS — I10 Essential (primary) hypertension: Secondary | ICD-10-CM

## 2014-08-17 DIAGNOSIS — R7309 Other abnormal glucose: Secondary | ICD-10-CM

## 2014-08-17 DIAGNOSIS — F3289 Other specified depressive episodes: Secondary | ICD-10-CM

## 2014-08-17 DIAGNOSIS — D649 Anemia, unspecified: Secondary | ICD-10-CM

## 2014-08-17 DIAGNOSIS — F32A Depression, unspecified: Secondary | ICD-10-CM

## 2014-08-17 DIAGNOSIS — IMO0001 Reserved for inherently not codable concepts without codable children: Secondary | ICD-10-CM

## 2014-08-17 DIAGNOSIS — M25511 Pain in right shoulder: Secondary | ICD-10-CM

## 2014-08-17 LAB — CBC WITH DIFFERENTIAL/PLATELET
BASOS PCT: 0 % (ref 0–1)
Basophils Absolute: 0 10*3/uL (ref 0.0–0.1)
Eosinophils Absolute: 0.2 10*3/uL (ref 0.0–0.7)
Eosinophils Relative: 2 % (ref 0–5)
HCT: 38.6 % (ref 36.0–46.0)
HEMOGLOBIN: 13.1 g/dL (ref 12.0–15.0)
Lymphocytes Relative: 42 % (ref 12–46)
Lymphs Abs: 4 10*3/uL (ref 0.7–4.0)
MCH: 27.5 pg (ref 26.0–34.0)
MCHC: 33.9 g/dL (ref 30.0–36.0)
MCV: 81.1 fL (ref 78.0–100.0)
MONO ABS: 0.6 10*3/uL (ref 0.1–1.0)
MONOS PCT: 6 % (ref 3–12)
NEUTROS PCT: 50 % (ref 43–77)
Neutro Abs: 4.8 10*3/uL (ref 1.7–7.7)
Platelets: 301 10*3/uL (ref 150–400)
RBC: 4.76 MIL/uL (ref 3.87–5.11)
RDW: 13.9 % (ref 11.5–15.5)
WBC: 9.6 10*3/uL (ref 4.0–10.5)

## 2014-08-17 MED ORDER — HYDROCHLOROTHIAZIDE 25 MG PO TABS: 25.0000 mg | ORAL_TABLET | Freq: Every day | ORAL | Status: DC

## 2014-08-17 NOTE — Progress Notes (Signed)
Complete Physical  Assessment and Plan: 1. Essential hypertension - [place], RETROPERITNL ABD,  LTD  2. Depression remissin  3. Vitamin D deficiency Check level  4. Routine general medical examination at a health care facility - CBC with Differential - BASIC METABOLIC PANEL WITH GFR - Hepatic function panel - Lipid panel - TSH - Hemoglobin A1c - Insulin, fasting - Vit D  25 hydroxy (rtn osteoporosis monitoring) - Urinalysis, Routine w reflex microscopic - Microalbumin / creatinine urine ratio - Vitamin B12 - Magnesium - Iron and TIBC - Ferritin - EKG 12-Lead  5. Obesity, Class II, BMI 35-39.9, with comorbidity  long discussion about weight loss, diet, and exercise  6. Neck pain on right side/right shoulder pain Continue to do NSAIDS, has had ESI with continuing neck pain possibly large breast contribute to pain, will work on weight loss and if continue neck pain, yeast injections will try for reduction.   7. Other abnormal glucose Discussed general issues about diabetes pathophysiology and management., Educational material distributed., Suggested low cholesterol diet., Encouraged aerobic exercise., Discussed foot care., Reminded to get yearly retinal exam.  8. Anemia, unspecified Check CBC  9. Kidney stone history Continue follow up, increase fluids, will switch to JUST HCTZ to see if this helps decrease stones  Discussed med's effects and SE's. Screening labs and tests as requested with regular follow-up as recommended.  HPI 58 y.o. female  presents for a complete physical.  Her blood pressure has been controlled at home, today their BP is BP: 138/92 mmHg She does workout, she walks. She denies chest pain, shortness of breath, dizziness.  She is not on cholesterol medication and denies myalgias. Her cholesterol is at goal. The cholesterol last visit was:   A1C has been normal but her insulin has been elevated, she was tried Last A1C in the office was:  Lab Results   Component Value Date   HGBA1C 5.4 02/10/2014   Patient is on Vitamin D supplement.   Lab Results  Component Value Date   VD25OH 42 02/10/2014     Has recurrent kidney stones, on maxide, see's Dr. Benancio Deeds in [place].  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 She has bilateral shoulder indentations and occ yeast under her breast during the summer. She has to wear two bras for support and has been unable to workout due to pain from her neck.  She BMI is Body mass index is 35.08 kg/(m^2)., she is working on diet and exercise and has done well.  Wt Readings from Last 3 Encounters:  08/17/14 224 lb (101.606 kg)  02/10/14 215 lb (97.523 kg)     Current Medications:  Current Outpatient Prescriptions on File Prior to Visit  Medication Sig Dispense Refill  . triamterene-hydrochlorothiazide (MAXZIDE-25) 37.5-25 MG per tablet Take 1 tablet by mouth daily.  90 tablet  0   No current facility-administered medications on file prior to visit.   Health Maintenance:   Tetanus: 2007 Pneumovax: 2000 Flu vaccine: 2014 Zostavax: Pap:2010 s/p hysterectomy MGM: 2013 DEXA: Colonoscopy: DUE EGD:  Patient Care Team: Lucky Cowboy, MD as PCP - General (Internal Medicine) Dr. Benancio Deeds, urologist  Allergies: No Known Allergies Medical History:  Past Medical History  Diagnosis Date  . Hypertension   . Depression   . Shingles     chest-right  . Obesity   . Kidney stones 11-2011  .  Vitamin D deficiency    Surgical History:  Past Surgical History  Procedure Laterality Date  . Lithotripsy    . Ear cyst excision Left   . Abdominal hysterectomy      partial   Family History:  Family History  Problem Relation Age of Onset  . Hypertension Mother   . Cancer Mother     breast  . Stroke Father   . Diabetes Father   . Hyperlipidemia Father     Social History:  History  Substance Use Topics  . Smoking status: Never Smoker   . Smokeless tobacco: Not on file  . Alcohol Use: No   Review of Systems: [X]  = complains of  [ ]  = denies  General: Fatigue [ ]  Fever [ ]  Chills [ ]  Weakness [ ]   Insomnia [ ] Weight change [ ]  Night sweats [ ]   Change in appetite [ ]  Eyes: Redness [ ]  Blurred vision [ ]  Diplopia [ ]  Discharge [ ]   ENT: Congestion [ ]  Sinus Pain [ ]  Post Nasal Drip [ ]  Sore Throat [ ]  Earache [ ]  hearing loss [ ]  Tinnitus [ ]  Snoring [ ]   Cardiac: Chest pain/pressure [ ]  SOB [ ]  Orthopnea [ ]   Palpitations [ ]   Paroxysmal nocturnal dyspnea[ ]  Claudication [ ]  Edema [ ]   Pulmonary: Cough [ ]  Wheezing[ ]   SOB [ ]   Pleurisy [ ]   GI: Nausea [ ]  Vomiting[ ]  Dysphagia[ ]  Heartburn[ ]  Abdominal pain [ ]  Constipation [ ] ; Diarrhea [ ]  BRBPR [ ]  Melena[ ]  Bloating [ ]  Hemorrhoids [ ]   GU: Hematuria[ ]  Dysuria [ ]  Nocturia[ ]  Urgency [ ]   Hesitancy [ ]  Discharge [ ]  Frequency [ ]   Breast:  Breast lumps [ ]   nipple discharge [ ]    Neuro: Headaches[ ]  Vertigo[ ]  Paresthesias[ ]  Spasm [ ]  Speech changes [ ]  Incoordination [ ]   Ortho: Arthritis [ ]  Joint pain [ ]  Muscle pain [ ]  Joint swelling [ ]  Back Pain [ ]  Skin:  Rash [ ]   Pruritis [ ]  Change in skin lesion [ ]   Psych: Depression[ ]  Anxiety[ ]  Confusion [ ]  Memory loss [ ]   Heme/Lypmh: Bleeding [ ]  Bruising [ ]  Enlarged lymph nodes [ ]   Endocrine: Visual blurring [ ]  Paresthesia [ ]  Polyuria [ ]  Polydypsea [ ]    Heat/cold intolerance [ ]  Hypoglycemia [ ]   Physical Exam: Estimated body mass index is 35.08 kg/(m^2) as calculated from the following:   Height as of this encounter: 5\' 7"  (1.702 m).   Weight as of this encounter: 224 lb (101.606 kg). BP 138/92  Pulse 86  Temp(Src) 98.4 F (36.9 C) (Temporal)  Resp 16  Ht 5\' 7"  (1.702 m)  Wt 224 lb (101.606 kg)  BMI 35.08 kg/m2 General Appearance: Well nourished, in no apparent distress. Eyes: PERRLA, EOMs, conjunctiva no  swelling or erythema, normal fundi and vessels. Sinuses: No Frontal/maxillary tenderness ENT/Mouth: Ext aud canals clear, normal light reflex with TMs without erythema, bulging.  Good dentition. No erythema, swelling, or exudate on post pharynx. Tonsils not swollen or erythematous. Hearing normal.  Neck: Supple, thyroid normal. No bruits Respiratory: Respiratory effort normal, BS equal bilaterally without rales, rhonchi, wheezing or stridor. Cardio: RRR without murmurs, rubs or gallops. Brisk peripheral pulses without edema.  Chest: symmetric, with normal excursions and percussion. Breasts: Large pendulous breast. Symmetric, without lumps, nipple discharge, retractions. Abdomen: Soft, +BS. Non tender, no guarding, rebound, hernias, masses, or organomegaly. Marland Kitchen  Lymphatics: Non tender without lymphadenopathy.  Genitourinary: defer Musculoskeletal: Full ROM all peripheral extremities,5/5 strength, and normal gait. Skin: Warm, dry without rashes, lesions, ecchymosis.  Neuro: Cranial nerves intact, reflexes equal bilaterally. Normal muscle tone, no cerebellar symptoms. Sensation intact.  Psych: Awake and oriented X 3, normal affect, Insight and Judgment appropriate.   EKG: WNL no changes. AORTA SCAN: WNL    Heather Harvey, Heather Harvey 2:09 PM

## 2014-08-17 NOTE — Patient Instructions (Addendum)
Suggest 3D Mammogram, please call schedule  We want weight loss that will last so you should lose 1-2 pounds a week.  THAT IS IT! Please pick THREE things a month to change. Once it is a habit check off the item. Then pick another three items off the list to become habits.  If you are already doing a habit on the list GREAT!  Cross that item off! o Don't drink your calories. Ie, alcohol, soda, fruit juice, and sweet tea.  o Drink more water. Drink a glass when you feel hungry or before each meal.  o Eat breakfast - Complex carb and protein (likeDannon light and fit yogurt, oatmeal, fruit, eggs, [place]key bacon). o Measure your cereal.  Eat no more than one cup a day. (ie [place]ashi) o Eat an apple a day. o Add a vegetable a day. o Try a new vegetable a month. o Use Pam! Stop using oil or butter to cook. o Don't finish your plate or use smaller plates. o Share your dessert. o Eat sugar free Jello for dessert or frozen grapes. o Don't eat 2-3 hours before bed. o Switch to whole wheat bread, pasta, and brown rice. o Make healthier choices when you eat out. No fries! o Pick baked chicken, NOT fried. o Don't forget to SLOW DOWN when you eat. It is not going anywhere.  o Take the stairs. o Park far away in the parking lot o [employer] Farm[employer] soup cans (or weights) for 10 minutes while watching TV. o Walk at work for 10 minutes during break. o Walk outside 1 time a week with your friend, kids, dog, or significant other. o Start a walking group at church. o Walk the mall as much as you can tolerate.  o Keep a food diary. o Weigh yourself daily. o Walk for 15 minutes 3 days per week. o Cook at home more often and eat out less.  If life happens and you go back to old habits, it is okay.  Just start over. You can do it!   If you experience chest pain, get short of breath, or tired during the exercise, please stop immediately and inform your doctor.     Bad carbs also include fruit juice, alcohol, and sweet tea.  These are empty calories that do not signal to your brain that you are full.   Please remember the good carbs are still carbs which convert into sugar. So please measure them out no more than 1/2-1 cup of rice, oatmeal, pasta, and beans.  Veggies are however free foods! Pile them on.   I like lean protein at every meal such as chicken, Mal[place]key, pork chops, cottage cheese, etc. Just do not fry these meats and please center your meal around vegetable, the meats should be a side dish.   No all fruit is created equal. Please see the list below, the fruit at the bottom is higher in sugars than the fruit at the top

## 2014-08-18 ENCOUNTER — Other Ambulatory Visit: Payer: Self-pay | Admitting: Physician Assistant

## 2014-08-18 DIAGNOSIS — Z1231 Encounter for screening mammogram for malignant neoplasm of breast: Secondary | ICD-10-CM

## 2014-08-18 LAB — IRON AND TIBC
%SAT: 18 % — AB (ref 20–55)
Iron: 70 ug/dL (ref 42–145)
TIBC: 399 ug/dL (ref 250–470)
UIBC: 329 ug/dL (ref 125–400)

## 2014-08-18 LAB — URINALYSIS, MICROSCOPIC ONLY
BACTERIA UA: NONE SEEN
CRYSTALS: NONE SEEN
Casts: NONE SEEN
SQUAMOUS EPITHELIAL / LPF: NONE SEEN

## 2014-08-18 LAB — LIPID PANEL
CHOLESTEROL: 158 mg/dL (ref 0–200)
HDL: 62 mg/dL (ref >39)
LDL Cholesterol: 84 mg/dL (ref 0–99)
TRIGLYCERIDES: 61 mg/dL (ref <150)
Total CHOL/HDL Ratio: 2.5 Ratio
VLDL: 12 mg/dL (ref 0–40)

## 2014-08-18 LAB — VITAMIN B12: Vitamin B-12: 498 pg/mL (ref 211–911)

## 2014-08-18 LAB — HEPATIC FUNCTION PANEL
ALBUMIN: 4.3 g/dL (ref 3.5–5.2)
ALT: 12 U/L (ref 0–35)
AST: 16 U/L (ref 0–37)
Alkaline Phosphatase: 88 U/L (ref 39–117)
Bilirubin, Direct: 0.2 mg/dL (ref 0.0–0.3)
Indirect Bilirubin: 0.4 mg/dL (ref 0.2–1.2)
TOTAL PROTEIN: 7.5 g/dL (ref 6.0–8.3)
Total Bilirubin: 0.6 mg/dL (ref 0.2–1.2)

## 2014-08-18 LAB — BASIC METABOLIC PANEL WITH GFR
BUN: 13 mg/dL (ref 6–23)
CO2: 33 mEq/L — ABNORMAL HIGH (ref 19–32)
Calcium: 9.6 mg/dL (ref 8.4–10.5)
Chloride: 99 mEq/L (ref 96–112)
Creat: 0.87 mg/dL (ref 0.50–1.10)
GFR, EST AFRICAN AMERICAN: 85 mL/min
GFR, Est Non African American: 74 mL/min
GLUCOSE: 78 mg/dL (ref 70–99)
Potassium: 3.6 mEq/L (ref 3.5–5.3)
SODIUM: 140 meq/L (ref 135–145)

## 2014-08-18 LAB — URINALYSIS, ROUTINE W REFLEX MICROSCOPIC
Bilirubin Urine: NEGATIVE
Glucose, UA: NEGATIVE mg/dL
Ketones, ur: NEGATIVE mg/dL
LEUKOCYTES UA: NEGATIVE
Nitrite: NEGATIVE
PROTEIN: NEGATIVE mg/dL
Specific Gravity, Urine: <1.005 — ABNORMAL LOW (ref 1.005–1.030)
UROBILINOGEN UA: 0.2 mg/dL (ref 0.0–1.0)
pH: 6.5 (ref 5.0–8.0)

## 2014-08-18 LAB — MICROALBUMIN / CREATININE URINE RATIO
Creatinine, Urine: 35.8 mg/dL
Microalb Creat Ratio: 17.9 mg/g (ref 0.0–30.0)
Microalb, Ur: 0.64 mg/dL (ref 0.00–1.89)

## 2014-08-18 LAB — HEMOGLOBIN A1C
Hgb A1c MFr Bld: 5.8 % — ABNORMAL HIGH (ref <5.7)
MEAN PLASMA GLUCOSE: 120 mg/dL — AB (ref <117)

## 2014-08-18 LAB — MAGNESIUM: MAGNESIUM: 2.1 mg/dL (ref 1.5–2.5)

## 2014-08-18 LAB — TSH: TSH: 1.576 u[IU]/mL (ref 0.350–4.500)

## 2014-08-18 LAB — INSULIN, FASTING: INSULIN FASTING, SERUM: 35 u[IU]/mL — AB (ref 3–28)

## 2014-08-18 LAB — FERRITIN: Ferritin: 124 ng/mL (ref 10–291)

## 2014-08-18 LAB — VITAMIN D 25 HYDROXY (VIT D DEFICIENCY, FRACTURES): Vit D, 25-Hydroxy: 30 ng/mL (ref 30–89)

## 2014-08-23 ENCOUNTER — Ambulatory Visit (HOSPITAL_COMMUNITY)
Admission: RE | Admit: 2014-08-23 | Discharge: 2014-08-23 | Disposition: A | Discharge status: Home / Self Care | Payer: Managed Care, Other (non HMO) | Source: Ambulatory Visit | Attending: Physician Assistant | Admitting: Physician Assistant

## 2014-08-23 DIAGNOSIS — Z1231 Encounter for screening mammogram for malignant neoplasm of breast: Secondary | ICD-10-CM | POA: Diagnosis not present

## 2014-11-22 ENCOUNTER — Ambulatory Visit: Payer: Self-pay | Admitting: Physician Assistant

## 2014-12-21 ENCOUNTER — Encounter: Payer: Self-pay | Admitting: Physician Assistant

## 2014-12-21 ENCOUNTER — Ambulatory Visit (INDEPENDENT_AMBULATORY_CARE_PROVIDER_SITE_OTHER): Payer: Managed Care, Other (non HMO) | Admitting: Physician Assistant

## 2014-12-21 ENCOUNTER — Ambulatory Visit: Payer: Self-pay | Admitting: Physician Assistant

## 2014-12-21 VITALS — BP 138/84 | HR 76 | Temp 97.7°F | Resp 16 | Ht 67.25 in | Wt 223.0 lb

## 2014-12-21 DIAGNOSIS — R7309 Other abnormal glucose: Secondary | ICD-10-CM

## 2014-12-21 DIAGNOSIS — E669 Obesity, unspecified: Secondary | ICD-10-CM

## 2014-12-21 DIAGNOSIS — I10 Essential (primary) hypertension: Secondary | ICD-10-CM

## 2014-12-21 DIAGNOSIS — R7303 Prediabetes: Secondary | ICD-10-CM | POA: Insufficient documentation

## 2014-12-21 DIAGNOSIS — H6122 Impacted cerumen, left ear: Secondary | ICD-10-CM

## 2014-12-21 DIAGNOSIS — E559 Vitamin D deficiency, unspecified: Secondary | ICD-10-CM

## 2014-12-21 DIAGNOSIS — Z79899 Other long term (current) drug therapy: Secondary | ICD-10-CM

## 2014-12-21 LAB — CBC WITH DIFFERENTIAL/PLATELET
Basophils Absolute: 0 10*3/uL (ref 0.0–0.1)
Basophils Relative: 0 % (ref 0–1)
EOS ABS: 0.2 10*3/uL (ref 0.0–0.7)
Eosinophils Relative: 3 % (ref 0–5)
HEMATOCRIT: 37.2 % (ref 36.0–46.0)
HEMOGLOBIN: 13.3 g/dL (ref 12.0–15.0)
Lymphocytes Relative: 41 % (ref 12–46)
Lymphs Abs: 3.2 10*3/uL (ref 0.7–4.0)
MCH: 28.7 pg (ref 26.0–34.0)
MCHC: 35.8 g/dL (ref 30.0–36.0)
MCV: 80.2 fL (ref 78.0–100.0)
MONOS PCT: 6 % (ref 3–12)
MPV: 9.8 fL (ref 9.4–12.4)
Monocytes Absolute: 0.5 10*3/uL (ref 0.1–1.0)
Neutro Abs: 4 10*3/uL (ref 1.7–7.7)
Neutrophils Relative %: 50 % (ref 43–77)
Platelets: 279 10*3/uL (ref 150–400)
RBC: 4.64 MIL/uL (ref 3.87–5.11)
RDW: 13.7 % (ref 11.5–15.5)
WBC: 7.9 10*3/uL (ref 4.0–10.5)

## 2014-12-21 LAB — TSH: TSH: 1.867 u[IU]/mL (ref 0.350–4.500)

## 2014-12-21 LAB — HEPATIC FUNCTION PANEL
ALT: 12 U/L (ref 0–35)
AST: 16 U/L (ref 0–37)
Albumin: 3.9 g/dL (ref 3.5–5.2)
Alkaline Phosphatase: 80 U/L (ref 39–117)
BILIRUBIN DIRECT: 0.2 mg/dL (ref 0.0–0.3)
BILIRUBIN INDIRECT: 0.4 mg/dL (ref 0.2–1.2)
Total Bilirubin: 0.6 mg/dL (ref 0.2–1.2)
Total Protein: 7.1 g/dL (ref 6.0–8.3)

## 2014-12-21 LAB — BASIC METABOLIC PANEL WITH GFR
BUN: 13 mg/dL (ref 6–23)
CHLORIDE: 102 meq/L (ref 96–112)
CO2: 26 mEq/L (ref 19–32)
Calcium: 9.6 mg/dL (ref 8.4–10.5)
Creat: 0.9 mg/dL (ref 0.50–1.10)
GFR, Est African American: 82 mL/min
GFR, Est Non African American: 71 mL/min
GLUCOSE: 90 mg/dL (ref 70–99)
Potassium: 3.5 mEq/L (ref 3.5–5.3)
SODIUM: 139 meq/L (ref 135–145)

## 2014-12-21 LAB — HEMOGLOBIN A1C
Hgb A1c MFr Bld: 5.7 % — ABNORMAL HIGH (ref <5.7)
Mean Plasma Glucose: 117 mg/dL — ABNORMAL HIGH (ref <117)

## 2014-12-21 LAB — MAGNESIUM: MAGNESIUM: 2 mg/dL (ref 1.5–2.5)

## 2014-12-21 MED ORDER — TRIAMTERENE-HCTZ 37.5-25 MG PO TABS: 1.0000 | ORAL_TABLET | Freq: Every day | ORAL | Status: DC

## 2014-12-21 NOTE — Progress Notes (Signed)
Assessment and Plan:  Hypertension: Continue medication, monitor blood pressure at home. Continue DASH diet.  Reminder to go to the ER if any CP, SOB, nausea, dizziness, severe HA, changes vision/speech, left arm numbness and tingling, and jaw pain. 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 Check cholesterol.  Pre-diabetes-Continue diet and exercise. Check A1C Vitamin D Def- check level and start 2000 IU, will be careful with kidney stone history. .  Obesity with co morbidities- long discussion about weight loss, diet, and exercise Cerumen impaction- stop using Qtips, use OTC drops/oil, irrigation done in the office   Continue diet and meds as discussed. Further disposition pending results of labs.  HPI 58 y.o. AA female  presents for 6 month follow up with hypertension, hyperlipidemia, prediabetes and vitamin D. Her blood pressure has been controlled at home, today their BP is BP: 138/84 mmHg  Last GFR was 85, she has CKD due to HTN.  She does not workout due to the holidays but she has joined the gym and will start working out. She denies chest pain, shortness of breath, dizziness.  She is not on cholesterol medication and denies myalgias. Her cholesterol is at goal. The cholesterol last visit was:   Lab Results  Component Value Date   CHOL 158 08/17/2014   HDL 62 08/17/2014   LDLCALC 84 08/17/2014   TRIG 61 08/17/2014   CHOLHDL 2.5 08/17/2014   She has been working on diet and exercise for prediabetes, and denies paresthesia of the feet, polydipsia, polyuria and visual disturbances. Last A1C in the office was:  Lab Results  Component Value Date   HGBA1C 5.8* 08/17/2014   Patient is on Vitamin D supplement, has a history of kidney stones and states that her sister does to and that she was suppose to be off vitamin D and calcium.   Lab Results  Component Value Date   VD25OH 30 08/17/2014     BMI is Body  mass index is 34.67 kg/(m^2)., she is working on diet and exercise. Wt Readings from Last 3 Encounters:  12/21/14 223 lb (101.152 kg)  08/17/14 224 lb (101.606 kg)  02/10/14 215 lb (97.523 kg)     Current Medications:  Current Outpatient Prescriptions on File Prior to Visit  Medication Sig Dispense Refill  . triamterene-hydrochlorothiazide (MAXZIDE-25) 37.5-25 MG per tablet Take 1 tablet by mouth daily. 90 tablet 0   No current facility-administered medications on file prior to visit.   Medical History:  Past Medical History  Diagnosis Date  . Hypertension   . Depression   . Shingles     chest-right  . Obesity   . Kidney stones 11-2011  . Vitamin D deficiency    Allergies: No Known Allergies   Review of Systems:  Review of Systems  Constitutional: Negative.   HENT: Positive for ear pain (left ear feels full, decreased hearing) and hearing loss.   Eyes: Negative.   Respiratory: Negative.   Cardiovascular: Negative.   Gastrointestinal: Negative.   Genitourinary: Negative.   Musculoskeletal: Negative.   Skin: Negative.   Neurological: Negative.   Endo/Heme/Allergies: Negative.   Psychiatric/Behavioral: Negative.     Family history- Review and unchanged Social history- Review and unchanged Physical Exam: BP 138/84 mmHg  Pulse 76  Temp(Src) 97.7 F (36.5 C)  Resp 16  Ht 5' 7.25" (1.708 m)  Wt 223 lb (101.152 kg)  BMI 34.67 kg/m2 Wt Readings from Last 3 Encounters:  12/21/14 223 lb (101.152 kg)  08/17/14 224 lb (101.606 kg)  02/10/14 215 lb (97.523 kg)   General Appearance: Well nourished, in no apparent distress. Eyes: PERRLA, EOMs, conjunctiva no swelling or erythema Sinuses: No Frontal/maxillary tenderness ENT/Mouth: Ext aud canals clear on the right but + cerumen impaction on the left. TMs without erythema, bulging. No erythema, swelling, or exudate on post pharynx.  Tonsils not swollen or erythematous. Hearing normal.  Neck: Supple, thyroid normal.   Respiratory: Respiratory effort normal, BS equal bilaterally without rales, rhonchi, wheezing or stridor.  Cardio: RRR with no MRGs. Brisk peripheral pulses without edema.  Abdomen: Soft, + BS, obese  Non tender, no guarding, rebound, hernias, masses. Lymphatics: Non tender without lymphadenopathy.  Musculoskeletal: Full ROM, 5/5 strength, normal gait.  Skin: Warm, dry without rashes, lesions, ecchymosis.  Neuro: Cranial nerves intact. Normal muscle tone, no cerebellar symptoms. Sensation intact.  Psych: Awake and oriented X 3, normal affect, Insight and Judgment appropriate.    Quentin Mullingollier, Caylon Saine, PA-C 8:52 AM [facility]

## 2014-12-21 NOTE — Patient Instructions (Signed)
Your Vitamin D is not in range, we want that around 60-80. Please make sure that you are taking your Vitamin D as directed. It is very important as a natural antiinflammatory helping hair, skin, and nails, as well as reducing stroke and heart attack risk. It helps your bones and helps with mood. It also decreases numerous cancer risks so please take it as directed. Add 2000 IU daily.   We want weight loss that will last so you should lose 1-2 pounds a week.  THAT IS IT! Please pick THREE things a month to change. Once it is a habit check off the item. Then pick another three items off the list to become habits.  If you are already doing a habit on the list GREAT!  Cross that item off! o Don't drink your calories. Ie, alcohol, soda, fruit juice, and sweet tea.  o Drink more water- 64% a day. Drink a glass when you feel hungry or before each meal.  o Eat breakfast - Complex carb and protein (likeDannon light and fit yogurt, oatmeal, fruit, eggs, [place]key bacon). o Measure your cereal.  Eat no more than one cup a day. (ie [place]ashi) o Eat an apple a day. o Add a vegetable a day. o Try a new vegetable a month. o Use Pam! Stop using oil or butter to cook. o Don't finish your plate or use smaller plates. o Share your dessert. o Eat sugar free Jello for dessert or frozen grapes. o Don't eat 2-3 hours before bed. o Switch to whole wheat bread, pasta, and brown rice. o Make healthier choices when you eat out. No fries! o Pick baked chicken, NOT fried. o Don't forget to SLOW DOWN when you eat. It is not going anywhere.  o Take the stairs. o Park far away in the parking lot o [employer] soup cans (or weights) for 10 minutes while watching TV. o Walk at work for 10 minutes during break. o Walk outside 1 time a week with your friend, kids, dog, or significant other. o Start a walking group at church. o Walk the mall as much as you can tolerate.  o Keep a food diary. o Weigh yourself daily. o Walk for 15 minutes 3  days per week. o Cook at home more often and eat out less.  If life happens and you go back to old habits, it is okay.  Just start over. You can do it!   If you experience chest pain, get short of breath, or tired during the exercise, please stop immediately and inform your doctor.      Bad carbs also include fruit juice, alcohol, and sweet tea. These are empty calories that do not signal to your brain that you are full.   Please remember the good carbs are still carbs which convert into sugar. So please measure them out no more than 1/2-1 cup of rice, oatmeal, pasta, and beans  Veggies are however free foods! Pile them on.   Not all fruit is created equal. Please see the list below, the fruit at the bottom is higher in sugars than the fruit at the top. Please avoid all dried fruits.

## 2014-12-22 LAB — VITAMIN D 25 HYDROXY (VIT D DEFICIENCY, FRACTURES): VIT D 25 HYDROXY: 23 ng/mL — AB (ref 30–100)

## 2014-12-22 LAB — INSULIN, FASTING: INSULIN FASTING, SERUM: 17.7 u[IU]/mL (ref 2.0–19.6)

## 2015-04-22 ENCOUNTER — Other Ambulatory Visit: Payer: Self-pay | Admitting: Physician Assistant

## 2015-05-29 ENCOUNTER — Other Ambulatory Visit: Payer: Self-pay | Admitting: Physician Assistant

## 2015-07-20 ENCOUNTER — Ambulatory Visit (INDEPENDENT_AMBULATORY_CARE_PROVIDER_SITE_OTHER): Payer: BLUE CROSS/BLUE SHIELD | Admitting: Internal Medicine

## 2015-07-20 ENCOUNTER — Encounter: Payer: Self-pay | Admitting: Internal Medicine

## 2015-07-20 VITALS — BP 152/100 | HR 86 | Temp 98.4°F | Resp 18 | Ht 67.25 in | Wt 230.0 lb

## 2015-07-20 DIAGNOSIS — R079 Chest pain, unspecified: Secondary | ICD-10-CM

## 2015-07-20 DIAGNOSIS — R002 Palpitations: Secondary | ICD-10-CM

## 2015-07-20 LAB — CBC WITH DIFFERENTIAL/PLATELET
BASOS ABS: 0 10*3/uL (ref 0.0–0.1)
Basophils Relative: 0 % (ref 0–1)
EOS ABS: 0.3 10*3/uL (ref 0.0–0.7)
EOS PCT: 3 % (ref 0–5)
HCT: 38.1 % (ref 36.0–46.0)
Hemoglobin: 12.9 g/dL (ref 12.0–15.0)
Lymphocytes Relative: 45 % (ref 12–46)
Lymphs Abs: 4.1 10*3/uL — ABNORMAL HIGH (ref 0.7–4.0)
MCH: 27.6 pg (ref 26.0–34.0)
MCHC: 33.9 g/dL (ref 30.0–36.0)
MCV: 81.4 fL (ref 78.0–100.0)
MONO ABS: 0.5 10*3/uL (ref 0.1–1.0)
MONOS PCT: 5 % (ref 3–12)
MPV: 9.5 fL (ref 8.6–12.4)
NEUTROS ABS: 4.3 10*3/uL (ref 1.7–7.7)
Neutrophils Relative %: 47 % (ref 43–77)
PLATELETS: 297 10*3/uL (ref 150–400)
RBC: 4.68 MIL/uL (ref 3.87–5.11)
RDW: 13.7 % (ref 11.5–15.5)
WBC: 9.1 10*3/uL (ref 4.0–10.5)

## 2015-07-20 LAB — BASIC METABOLIC PANEL WITH GFR
BUN: 14 mg/dL (ref 6–23)
CHLORIDE: 102 meq/L (ref 96–112)
CO2: 28 mEq/L (ref 19–32)
Calcium: 9.5 mg/dL (ref 8.4–10.5)
Creat: 0.86 mg/dL (ref 0.50–1.10)
GFR, EST NON AFRICAN AMERICAN: 75 mL/min
GFR, Est African American: 86 mL/min
Glucose, Bld: 70 mg/dL (ref 70–99)
Potassium: 3.3 mEq/L — ABNORMAL LOW (ref 3.5–5.3)
Sodium: 142 mEq/L (ref 135–145)

## 2015-07-20 LAB — HEPATIC FUNCTION PANEL
ALT: 14 U/L (ref 0–35)
AST: 17 U/L (ref 0–37)
Albumin: 3.7 g/dL (ref 3.5–5.2)
Alkaline Phosphatase: 77 U/L (ref 39–117)
BILIRUBIN INDIRECT: 0.3 mg/dL (ref 0.2–1.2)
BILIRUBIN TOTAL: 0.4 mg/dL (ref 0.2–1.2)
Bilirubin, Direct: 0.1 mg/dL (ref 0.0–0.3)
Total Protein: 7.1 g/dL (ref 6.0–8.3)

## 2015-07-20 LAB — MAGNESIUM: Magnesium: 2.1 mg/dL (ref 1.5–2.5)

## 2015-07-20 LAB — TROPONIN I: Troponin I: <0.01 ng/mL (ref <0.06)

## 2015-07-20 MED ORDER — ATENOLOL 25 MG PO TABS: 25.0000 mg | ORAL_TABLET | Freq: Every day | ORAL | Status: DC

## 2015-07-20 NOTE — Progress Notes (Signed)
Subjective:    Patient ID: Heather Harvey, female    DOB: Nov 07, 1956, 59 y.o.   MRN: [id number]  Chest Pain  Associated symptoms include diaphoresis, dizziness, palpitations and shortness of breath. Pertinent negatives include no abdominal pain, cough, fever, nausea, numbness, vomiting or weakness.  Patient presents to the office for evaluation of chest pain which has been going on intermittently for the past 2 days.  Patient states that she started having a squeezing severe chest pain which started when she was standing and she felt like she was having some palpitations, sweating, and dizziness.  She reports that the pain lasted for 3 minutes.  She reports that it took about 10 minutes for the flushing and hotness to go away.  The dizziness went away after the chest pain.  She reports that she has had a nagging headache since the incident.  She reports that she has been better since Tuesday but she felt like it was going to happen again today.  She reports that she had a little bit of chest discomfort today and some palpitations but it was not nearly as bad as it was on Tuesday.  She reports that she has had chest pains previously but it has never been this bad in the past.  No history of seeing cards.  Family history of heart problems.  Sister has possible tachycardia.  No sudden cardiac deaths.  Brother passed from PE induced right heart strain.  Never smoker.  Patient reports that she does have high BP.  She does not regularly check it at home but her reading to her today seems high.    Patient reports a diet low in caffeine and alcohol.  She reports that she drinks 1 cup of coffee 3 times weekly.  She reports that she does not drink alcohol on a regular basis.  No OTC meds.  No street drugs or medications not prescribed to her.     Review of Systems  Constitutional: Positive for diaphoresis and fatigue. Negative for fever and chills.  Respiratory: Positive for shortness of breath. Negative for  cough, chest tightness and wheezing.   Cardiovascular: Positive for chest pain and palpitations. Negative for leg swelling.  Gastrointestinal: Negative for nausea, vomiting and abdominal pain.  Neurological: Positive for dizziness. Negative for weakness, light-headedness and numbness.  Psychiatric/Behavioral: The patient is nervous/anxious.        Objective:   Physical Exam  Constitutional: She is oriented to person, place, and time. She appears well-developed and well-nourished. No distress.  HENT:  Head: Normocephalic.  Mouth/Throat: Oropharynx is clear and moist. No oropharyngeal exudate.  Eyes: Conjunctivae are normal. No scleral icterus.  Neck: Normal range of motion. Neck supple. No JVD present. No thyromegaly present.  Cardiovascular: Normal rate, regular rhythm, normal heart sounds and intact distal pulses.  Exam reveals no gallop and no friction rub.   No murmur heard. Pulmonary/Chest: Effort normal and breath sounds normal. No respiratory distress. She has no wheezes. She has no rales. She exhibits no tenderness.  Abdominal: Soft. Bowel sounds are normal. She exhibits no distension and no mass. There is no tenderness. There is no rebound and no guarding.  Musculoskeletal: Normal range of motion.  Lymphadenopathy:    She has no cervical adenopathy.  Neurological: She is alert and oriented to person, place, and time.  Skin: Skin is warm and dry. She is not diaphoretic.  Psychiatric: She has a normal mood and affect. Her behavior is normal. Judgment and thought content  normal.  Nursing note and vitals reviewed.         Assessment & Plan:    1. Chest pain, unspecified chest pain type -cont HCTZ - CBC with Differential/Platelet - BASIC METABOLIC PANEL WITH GFR - Hepatic function panel - Magnesium - Troponin I - EKG 12-Lead - atenolol (TENORMIN) 25 MG tablet; Take 1 tablet (25 mg total) by mouth daily.  Dispense: 30 tablet; Refill: 11  2. Palpitations  - EKG  12-Lead - atenolol (TENORMIN) 25 MG tablet; Take 1 tablet (25 mg total) by mouth daily.  Dispense: 30 tablet; Refill: 11  Recheck in 2 weeks.  If no improvement consider increase of atenolol vs. Cards referral.  Patient to go to ER if significant CP, SOB, or any other concerning symptoms.  She states understanding.

## 2015-07-20 NOTE — Patient Instructions (Signed)

## 2015-07-21 ENCOUNTER — Other Ambulatory Visit: Payer: Self-pay | Admitting: Internal Medicine

## 2015-07-21 MED ORDER — POTASSIUM CHLORIDE CRYS ER 20 MEQ PO TBCR: 20.0000 meq | EXTENDED_RELEASE_TABLET | Freq: Every day | ORAL | Status: DC

## 2015-08-23 ENCOUNTER — Ambulatory Visit (INDEPENDENT_AMBULATORY_CARE_PROVIDER_SITE_OTHER): Payer: BLUE CROSS/BLUE SHIELD | Admitting: Physician Assistant

## 2015-08-23 ENCOUNTER — Encounter: Payer: Self-pay | Admitting: Physician Assistant

## 2015-08-23 VITALS — BP 146/96 | HR 80 | Temp 98.4°F | Resp 16 | Ht 67.25 in | Wt 229.0 lb

## 2015-08-23 DIAGNOSIS — I10 Essential (primary) hypertension: Secondary | ICD-10-CM | POA: Diagnosis not present

## 2015-08-23 DIAGNOSIS — Z0001 Encounter for general adult medical examination with abnormal findings: Secondary | ICD-10-CM

## 2015-08-23 DIAGNOSIS — Z0000 Encounter for general adult medical examination without abnormal findings: Secondary | ICD-10-CM | POA: Diagnosis not present

## 2015-08-23 DIAGNOSIS — E559 Vitamin D deficiency, unspecified: Secondary | ICD-10-CM | POA: Diagnosis not present

## 2015-08-23 DIAGNOSIS — Z79899 Other long term (current) drug therapy: Secondary | ICD-10-CM

## 2015-08-23 DIAGNOSIS — E669 Obesity, unspecified: Secondary | ICD-10-CM

## 2015-08-23 DIAGNOSIS — M5030 Other cervical disc degeneration, unspecified cervical region: Secondary | ICD-10-CM | POA: Insufficient documentation

## 2015-08-23 DIAGNOSIS — R7303 Prediabetes: Secondary | ICD-10-CM

## 2015-08-23 DIAGNOSIS — F32A Depression, unspecified: Secondary | ICD-10-CM

## 2015-08-23 DIAGNOSIS — Z1211 Encounter for screening for malignant neoplasm of colon: Secondary | ICD-10-CM

## 2015-08-23 DIAGNOSIS — D649 Anemia, unspecified: Secondary | ICD-10-CM

## 2015-08-23 DIAGNOSIS — Z87442 Personal history of urinary calculi: Secondary | ICD-10-CM | POA: Insufficient documentation

## 2015-08-23 DIAGNOSIS — F329 Major depressive disorder, single episode, unspecified: Secondary | ICD-10-CM

## 2015-08-23 LAB — CBC WITH DIFFERENTIAL/PLATELET
Basophils Absolute: 0 10*3/uL (ref 0.0–0.1)
Basophils Relative: 0 % (ref 0–1)
EOS PCT: 4 % (ref 0–5)
Eosinophils Absolute: 0.3 10*3/uL (ref 0.0–0.7)
HCT: 37.6 % (ref 36.0–46.0)
HEMOGLOBIN: 12.9 g/dL (ref 12.0–15.0)
LYMPHS ABS: 3.6 10*3/uL (ref 0.7–4.0)
LYMPHS PCT: 42 % (ref 12–46)
MCH: 27.9 pg (ref 26.0–34.0)
MCHC: 34.3 g/dL (ref 30.0–36.0)
MCV: 81.4 fL (ref 78.0–100.0)
MONOS PCT: 7 % (ref 3–12)
MPV: 9.6 fL (ref 8.6–12.4)
Monocytes Absolute: 0.6 10*3/uL (ref 0.1–1.0)
Neutro Abs: 4 10*3/uL (ref 1.7–7.7)
Neutrophils Relative %: 47 % (ref 43–77)
Platelets: 285 10*3/uL (ref 150–400)
RBC: 4.62 MIL/uL (ref 3.87–5.11)
RDW: 14.3 % (ref 11.5–15.5)
WBC: 8.5 10*3/uL (ref 4.0–10.5)

## 2015-08-23 LAB — HEMOGLOBIN A1C
Hgb A1c MFr Bld: 5.6 % (ref <5.7)
MEAN PLASMA GLUCOSE: 114 mg/dL (ref <117)

## 2015-08-23 MED ORDER — ATENOLOL 50 MG PO TABS: 50.0000 mg | ORAL_TABLET | Freq: Every day | ORAL | Status: DC

## 2015-08-23 NOTE — Progress Notes (Signed)
Complete Physical  Assessment and Plan: 1. Essential hypertension -- increase atenolol to 50mg  daily, take HCTZ, PRN DASH diet, exercise and monitor at home. Call if greater than 130/80.  - [place], RETROPERITNL ABD,  LTD - EKG  2. Depression remissin  3. Vitamin D deficiency Check level Continue supplement, take more regularly  4. Routine general medical examination at a health care facility - CBC with Differential - BASIC METABOLIC PANEL WITH GFR - Hepatic function panel - Lipid panel - TSH - Hemoglobin A1c - Insulin, fasting - Vit D  25 hydroxy (rtn osteoporosis monitoring) - Urinalysis, Routine w reflex microscopic - Microalbumin / creatinine urine ratio - Vitamin B12 - Magnesium - Iron and TIBC - Ferritin - EKG 12-Lead  5. Obesity, Class II, BMI 35-39.9, with comorbidity  long discussion about weight loss, diet, and exercise  6. Neck pain on right side/right shoulder pain Continue to do NSAIDS, has had ESI with continuing neck pain possibly large breast contribute to pain, will work on weight loss and if continue neck pain, yeast injections will try for reduction.   7. Other abnormal glucose Discussed general issues about diabetes pathophysiology and management., Educational material distributed., Suggested low cholesterol diet., Encouraged aerobic exercise., Discussed foot care., Reminded to get yearly retinal exam.  8. Anemia, unspecified Check labs  9. Kidney stone history Continue follow up, increase fluids  10. Colon cancer screen Will send for over due screening colonoscopy  Discussed med's effects and SE's. Screening labs and tests as requested with regular follow-up as recommended.  HPI 59 y.o. female  presents for a complete physical.  Her blood pressure has not been controlled at home, today their BP is BP: (!) 146/96 mmHg. She is on atenolol 25mg  daily but only takes HCTZ PRN.  She does workout, she walks. She denies chest pain, shortness of breath,  dizziness. She was recently seen on 07/20/2015 for palpitations, flushing, and sharp CP with dizziness. She was started on atenolol and she has not had anymore palpitations since then. .   She is not on cholesterol medication and denies myalgias. Her cholesterol is at goal. The cholesterol last visit was:   Lab Results  Component Value Date   CHOL 158 08/17/2014   HDL 62 08/17/2014   LDLCALC 84 08/17/2014   TRIG 61 08/17/2014   CHOLHDL 2.5 08/17/2014   She has preDM and elevated insulin due to obesity, denies DM polys:  Lab Results  Component Value Date   HGBA1C 5.7* 12/21/2014   Patient is on Vitamin D supplement.   Lab Results  Component Value Date   VD25OH 23* 12/21/2014     Has recurrent kidney stones, on maxide, see's Dr. Benancio Deeds in [place].  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 She has bilateral shoulder indentations and occ yeast under her breast during the summer. She has to wear two bras for support and has been unable to workout due to pain from her neck.  She BMI is Body mass index is 35.61 kg/(m^2)., she is working on diet and exercise and has done well.  Wt Readings from Last 3 Encounters:  08/23/15 229 lb (103.874 kg)  07/20/15 230 lb (104.327 kg)  12/21/14 223 lb (101.152 kg)    Current Medications:  Current Outpatient Prescriptions on File Prior to Visit  Medication Sig Dispense Refill  . atenolol (TENORMIN) 25 MG tablet Take 1 tablet (25 mg total) by mouth daily. 30 tablet 11  .  hydrochlorothiazide (HYDRODIURIL) 25 MG tablet TAKE 1 TABLET BY MOUTH EVERY DAY 90 tablet 0  . potassium chloride SA (K-DUR,KLOR-CON) 20 MEQ tablet Take 1 tablet (20 mEq total) by mouth daily. 3 tablet 0   No current facility-administered medications on file prior to visit.   Health Maintenance:   Tetanus: 2007 Pneumovax: 2000 Prevnar 13: due age 58 Flu vaccine: 2014 Zostavax: Pap:2010 s/p hysterectomy MGM:  07/2014 DEXA: Colonoscopy: DUE EGD: Pelvis US 2008 Ct AB pelvis 04/06/2014  Patient Care Team: Lucky Cowboy, MD as PCP - General (Internal Medicine) Dr. Benancio Deeds, urologist  Allergies: No Known Allergies Medical History:  Past Medical History  Diagnosis Date  . Hypertension   . Depression   . Shingles     chest-right  . Obesity   . Kidney stones 11-2011  . Vitamin D deficiency    Surgical History:  Past Surgical History  Procedure Laterality Date  . Lithotripsy    . Ear cyst excision Left   . Abdominal hysterectomy      partial   Family History:  Family History  Problem Relation Age of Onset  . Hypertension Mother   . Cancer Mother     breast  . Stroke Father   . Diabetes Father   . Hyperlipidemia Father    Social History:  Social History  Substance Use Topics  . Smoking status: Never Smoker   . Smokeless tobacco: None  . Alcohol Use: No   Review of Systems  Constitutional: Negative.   HENT: Negative for ear pain and hearing loss.   Eyes: Negative.   Respiratory: Negative.   Cardiovascular: Negative.  Negative for chest pain and palpitations.  Gastrointestinal: Negative.   Genitourinary: Negative.   Musculoskeletal: Positive for neck pain.  Skin: Positive for rash (under her breast, worst during the summer. ).  Neurological: Negative.   Endo/Heme/Allergies: Negative.   Psychiatric/Behavioral: Negative.    Physical Exam: Estimated body mass index is 35.61 kg/(m^2) as calculated from the following:   Height as of this encounter: 5' 7.25" (1.708 m).   Weight as of this encounter: 229 lb (103.874 kg). BP 146/96 mmHg  Pulse 80  Temp(Src) 98.4 F (36.9 C)  Resp 16  Ht 5' 7.25" (1.708 m)  Wt 229 lb (103.874 kg)  BMI 35.61 kg/m2 General Appearance: Well nourished, in no apparent distress. Eyes: PERRLA, EOMs, conjunctiva no swelling or erythema, normal fundi and vessels. Sinuses: No Frontal/maxillary tenderness ENT/Mouth: Ext aud canals clear,  normal light reflex with TMs without erythema, bulging.  Good dentition. No erythema, swelling, or exudate on post pharynx. Tonsils not swollen or erythematous. Hearing normal.  Neck: Supple, thyroid normal. No bruits Respiratory: Respiratory effort normal, BS equal bilaterally without rales, rhonchi, wheezing or stridor. Cardio: RRR without murmurs, rubs or gallops. Brisk peripheral pulses without edema.  Chest: symmetric, with normal excursions and percussion. Breasts: Large pendulous breast. Symmetric, without lumps, nipple discharge, retractions. Abdomen: Soft, +BS. Non tender, no guarding, rebound, hernias, masses, or organomegaly. .  Lymphatics: Non tender without lymphadenopathy.  Genitourinary: defer Musculoskeletal: Full ROM all peripheral extremities,5/5 strength, and normal gait. Skin: Warm, dry without rashes, lesions, ecchymosis.  Neuro: Cranial nerves intact, reflexes equal bilaterally. Normal muscle tone, no cerebellar symptoms. Sensation intact.  Psych: Awake and oriented X 3, normal affect, Insight and Judgment appropriate.   EKG: WNL no changes. AORTA SCAN: defer   Quentin Mulling 2:03 PM

## 2015-08-23 NOTE — Patient Instructions (Signed)

## 2015-08-24 LAB — URINALYSIS, MICROSCOPIC ONLY
Bacteria, UA: NONE SEEN [HPF]
Casts: NONE SEEN [LPF]
Crystals: NONE SEEN [HPF]
Squamous Epithelial / LPF: NONE SEEN [HPF] (ref <=5)
WBC UA: NONE SEEN WBC/HPF (ref <=5)
YEAST: NONE SEEN [HPF]

## 2015-08-24 LAB — BASIC METABOLIC PANEL WITH GFR
BUN: 10 mg/dL (ref 7–25)
CALCIUM: 9.1 mg/dL (ref 8.6–10.4)
CO2: 26 mmol/L (ref 20–31)
Chloride: 100 mmol/L (ref 98–110)
Creat: 0.78 mg/dL (ref 0.50–1.05)
GFR, EST NON AFRICAN AMERICAN: 83 mL/min (ref >=60)
GFR, Est African American: >89 mL/min (ref >=60)
GLUCOSE: 70 mg/dL (ref 65–99)
Potassium: 3.6 mmol/L (ref 3.5–5.3)
Sodium: 138 mmol/L (ref 135–146)

## 2015-08-24 LAB — URINALYSIS, ROUTINE W REFLEX MICROSCOPIC
Bilirubin Urine: NEGATIVE
Glucose, UA: NEGATIVE
Ketones, ur: NEGATIVE
LEUKOCYTES UA: NEGATIVE
Nitrite: NEGATIVE
PROTEIN: NEGATIVE
Specific Gravity, Urine: 1.007 (ref 1.001–1.035)
pH: 7 (ref 5.0–8.0)

## 2015-08-24 LAB — LIPID PANEL
CHOLESTEROL: 138 mg/dL (ref 125–200)
HDL: 52 mg/dL (ref >=46)
LDL CALC: 74 mg/dL (ref <130)
Total CHOL/HDL Ratio: 2.7 Ratio (ref <=5.0)
Triglycerides: 62 mg/dL (ref <150)
VLDL: 12 mg/dL (ref <30)

## 2015-08-24 LAB — MICROALBUMIN / CREATININE URINE RATIO
Creatinine, Urine: 27.8 mg/dL
MICROALB UR: 0.2 mg/dL (ref <2.0)
MICROALB/CREAT RATIO: 7.2 mg/g (ref 0.0–30.0)

## 2015-08-24 LAB — HEPATIC FUNCTION PANEL
ALBUMIN: 3.8 g/dL (ref 3.6–5.1)
ALK PHOS: 84 U/L (ref 33–130)
ALT: 11 U/L (ref 6–29)
AST: 15 U/L (ref 10–35)
BILIRUBIN INDIRECT: 0.3 mg/dL (ref 0.2–1.2)
Bilirubin, Direct: 0.2 mg/dL (ref <=0.2)
TOTAL PROTEIN: 6.7 g/dL (ref 6.1–8.1)
Total Bilirubin: 0.5 mg/dL (ref 0.2–1.2)

## 2015-08-24 LAB — IRON AND TIBC
%SAT: 20 % (ref 20–55)
IRON: 72 ug/dL (ref 42–145)
TIBC: 355 ug/dL (ref 250–470)
UIBC: 283 ug/dL (ref 125–400)

## 2015-08-24 LAB — INSULIN, FASTING: INSULIN FASTING, SERUM: 9.4 u[IU]/mL (ref 2.0–19.6)

## 2015-08-24 LAB — VITAMIN D 25 HYDROXY (VIT D DEFICIENCY, FRACTURES): Vit D, 25-Hydroxy: 29 ng/mL — ABNORMAL LOW (ref 30–100)

## 2015-08-24 LAB — TSH: TSH: 2.006 u[IU]/mL (ref 0.350–4.500)

## 2015-08-24 LAB — VITAMIN B12: Vitamin B-12: 372 pg/mL (ref 211–911)

## 2015-08-24 LAB — MAGNESIUM: Magnesium: 2 mg/dL (ref 1.5–2.5)

## 2015-08-24 LAB — FERRITIN: Ferritin: 73 ng/mL (ref 10–291)

## 2015-08-29 ENCOUNTER — Other Ambulatory Visit: Payer: Self-pay

## 2015-08-29 MED ORDER — VITAMIN D (ERGOCALCIFEROL) 1.25 MG (50000 UNIT) PO CAPS: 50000.0000 [IU] | ORAL_CAPSULE | Freq: Every day | ORAL | Status: DC

## 2015-10-07 ENCOUNTER — Encounter: Payer: Self-pay | Admitting: *Deleted

## 2015-12-31 HISTORY — PX: REDUCTION MAMMAPLASTY: SUR839

## 2016-02-28 ENCOUNTER — Other Ambulatory Visit: Payer: Self-pay

## 2016-02-28 ENCOUNTER — Encounter: Payer: Self-pay | Admitting: Physician Assistant

## 2016-02-28 ENCOUNTER — Ambulatory Visit (INDEPENDENT_AMBULATORY_CARE_PROVIDER_SITE_OTHER): Payer: BLUE CROSS/BLUE SHIELD | Admitting: Physician Assistant

## 2016-02-28 VITALS — BP 130/90 | HR 81 | Temp 97.9°F | Resp 16 | Ht 67.25 in | Wt 227.6 lb

## 2016-02-28 DIAGNOSIS — I10 Essential (primary) hypertension: Secondary | ICD-10-CM | POA: Diagnosis not present

## 2016-02-28 DIAGNOSIS — F329 Major depressive disorder, single episode, unspecified: Secondary | ICD-10-CM | POA: Diagnosis not present

## 2016-02-28 DIAGNOSIS — E669 Obesity, unspecified: Secondary | ICD-10-CM | POA: Diagnosis not present

## 2016-02-28 DIAGNOSIS — R7303 Prediabetes: Secondary | ICD-10-CM | POA: Diagnosis not present

## 2016-02-28 DIAGNOSIS — E559 Vitamin D deficiency, unspecified: Secondary | ICD-10-CM

## 2016-02-28 DIAGNOSIS — R350 Frequency of micturition: Secondary | ICD-10-CM | POA: Diagnosis not present

## 2016-02-28 DIAGNOSIS — Z79899 Other long term (current) drug therapy: Secondary | ICD-10-CM | POA: Diagnosis not present

## 2016-02-28 DIAGNOSIS — F32A Depression, unspecified: Secondary | ICD-10-CM

## 2016-02-28 DIAGNOSIS — D649 Anemia, unspecified: Secondary | ICD-10-CM

## 2016-02-28 LAB — BASIC METABOLIC PANEL WITH GFR
BUN: 20 mg/dL (ref 7–25)
CHLORIDE: 101 mmol/L (ref 98–110)
CO2: 29 mmol/L (ref 20–31)
CREATININE: 0.91 mg/dL (ref 0.50–1.05)
Calcium: 9.3 mg/dL (ref 8.6–10.4)
GFR, Est African American: 80 mL/min (ref >=60)
GFR, Est Non African American: 69 mL/min (ref >=60)
Glucose, Bld: 79 mg/dL (ref 65–99)
Potassium: 3.5 mmol/L (ref 3.5–5.3)
SODIUM: 141 mmol/L (ref 135–146)

## 2016-02-28 LAB — HEPATIC FUNCTION PANEL
ALT: 11 U/L (ref 6–29)
AST: 14 U/L (ref 10–35)
Albumin: 4.1 g/dL (ref 3.6–5.1)
Alkaline Phosphatase: 87 U/L (ref 33–130)
BILIRUBIN DIRECT: 0.1 mg/dL (ref <=0.2)
BILIRUBIN INDIRECT: 0.5 mg/dL (ref 0.2–1.2)
Total Bilirubin: 0.6 mg/dL (ref 0.2–1.2)
Total Protein: 7.3 g/dL (ref 6.1–8.1)

## 2016-02-28 LAB — CBC WITH DIFFERENTIAL/PLATELET
BASOS PCT: 0 % (ref 0–1)
Basophils Absolute: 0 10*3/uL (ref 0.0–0.1)
Eosinophils Absolute: 0.2 10*3/uL (ref 0.0–0.7)
Eosinophils Relative: 2 % (ref 0–5)
HCT: 39.8 % (ref 36.0–46.0)
HEMOGLOBIN: 13.4 g/dL (ref 12.0–15.0)
LYMPHS ABS: 3.4 10*3/uL (ref 0.7–4.0)
Lymphocytes Relative: 38 % (ref 12–46)
MCH: 27.8 pg (ref 26.0–34.0)
MCHC: 33.7 g/dL (ref 30.0–36.0)
MCV: 82.6 fL (ref 78.0–100.0)
MONOS PCT: 6 % (ref 3–12)
MPV: 10 fL (ref 8.6–12.4)
Monocytes Absolute: 0.5 10*3/uL (ref 0.1–1.0)
NEUTROS ABS: 4.9 10*3/uL (ref 1.7–7.7)
NEUTROS PCT: 54 % (ref 43–77)
Platelets: 297 10*3/uL (ref 150–400)
RBC: 4.82 MIL/uL (ref 3.87–5.11)
RDW: 13.7 % (ref 11.5–15.5)
WBC: 9 10*3/uL (ref 4.0–10.5)

## 2016-02-28 LAB — MAGNESIUM: Magnesium: 2.1 mg/dL (ref 1.5–2.5)

## 2016-02-28 LAB — IRON AND TIBC
%SAT: 18 % (ref 11–50)
Iron: 57 ug/dL (ref 45–160)
TIBC: 321 ug/dL (ref 250–450)
UIBC: 264 ug/dL (ref 125–400)

## 2016-02-28 LAB — LIPID PANEL
CHOL/HDL RATIO: 2.9 ratio (ref <=5.0)
Cholesterol: 143 mg/dL (ref 125–200)
HDL: 50 mg/dL (ref >=46)
LDL Cholesterol: 80 mg/dL (ref <130)
Triglycerides: 65 mg/dL (ref <150)
VLDL: 13 mg/dL (ref <30)

## 2016-02-28 LAB — HEMOGLOBIN A1C
HEMOGLOBIN A1C: 5.6 % (ref <5.7)
MEAN PLASMA GLUCOSE: 114 mg/dL (ref <117)

## 2016-02-28 MED ORDER — BUPROPION HCL ER (XL) 150 MG PO TB24: 150.0000 mg | ORAL_TABLET | ORAL | Status: DC

## 2016-02-28 MED ORDER — HYDROCHLOROTHIAZIDE 25 MG PO TABS: 25.0000 mg | ORAL_TABLET | Freq: Every day | ORAL | Status: DC

## 2016-02-28 NOTE — Progress Notes (Signed)
Assessment and Plan:  Hypertension: Continue atenolol 50, add back on HCTZ  daily, monitor blood pressure at home. Continue DASH diet.  Reminder to go to the ER if any CP, SOB, nausea, dizziness, severe HA, changes vision/speech, left arm numbness and tingling, and jaw pain. Cholesterol: Continue diet and exercise. Check cholesterol.  Pre-diabetes-Continue diet and exercise. Check A1C Vitamin D Def- check level and start 2000 IU, will be careful with kidney stone history. .  Obesity with co morbidities- long discussion about weight loss, diet, and exercise Headache- normal neuro, follow up dentist, info given about TMJ, treat HTN, add bASA Depression- check labs, add on wellbutrin, counseling suggested.   Follow up 1 month Continue diet and meds as discussed. Further disposition pending results of labs. Future Appointments Date Time Provider Department Center  08/22/2016 2:00 PM Quentin Mulling, PA-C [facility] None    HPI 60 y.o. AA female  presents for 6 month follow up with hypertension, hyperlipidemia, prediabetes and vitamin D. Her blood pressure has not been controlled at home, BP has been as high as 178/113 would recheck and would come down some, uncertain if some is the cuff she uses, today their BP is BP: 130/90 mmHg. She has had headache for several days, a little dizzy with nausea, denies sinus issues, changes in speech/vision, ear pain/fullness/tinnitus/decreased hearing.  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  She does not workout. States she does not want to do anything, does not want to eat, does not want to go out, or do anything, lost her husband 10 years ago, is alone, admits to some depression symtoms, family in [employer]. She denies chest pain, shortness of breath, dizziness.  She is not on cholesterol medication and denies myalgias. Her cholesterol is at goal. The cholesterol last visit was:   Lab Results  Component Value Date   CHOL 138 08/23/2015   HDL 52  08/23/2015   LDLCALC 74 08/23/2015   TRIG 62 08/23/2015   CHOLHDL 2.7 08/23/2015   She has been working on diet and exercise for prediabetes, and denies paresthesia of the feet, polydipsia, polyuria and visual disturbances. Last A1C in the office was:  Lab Results  Component Value Date   HGBA1C 5.6 08/23/2015   Patient is on Vitamin D supplement, she is on 50,000 once a week. Has not had any kidney stones.  Lab Results  Component Value Date   VD25OH 29* 08/23/2015     Has cervical DDD with radiation down her right side,has had ESI to cervical neck which helped. She has bilateral shoulder indentations and occ yeast under her breast during the summer. She has to wear two bras for support and has been unable to workout due to pain from her neck.  BMI is Body mass index is 35.39 kg/(m^2)., she is working on diet and exercise. Wt Readings from Last 3 Encounters:  02/28/16 227 lb 9.6 oz (103.239 kg)  08/23/15 229 lb (103.874 kg)  07/20/15 230 lb (104.327 kg)     Current Medications:  Current Outpatient Prescriptions on File Prior to Visit  Medication Sig Dispense Refill  . atenolol (TENORMIN) 50 MG tablet Take 1 tablet (50 mg total) by mouth daily. 90 tablet 1  . Vitamin D, Ergocalciferol, (DRISDOL) 50000 UNITS CAPS capsule Take 1 capsule (50,000 Units total) by mouth daily. 30 capsule 2   No current facility-administered medications on file prior to visit.   Medical History:  Past Medical History  Diagnosis Date  . Hypertension   .  Depression   . Shingles     chest-right  . Obesity   . Kidney stones 11-2011  . Vitamin D deficiency    Allergies: No Known Allergies   Review of Systems:  Review of Systems  Constitutional: Positive for malaise/fatigue.  HENT: Negative for congestion, ear pain, hearing loss, sore throat and tinnitus.   Eyes: Negative.  Negative for blurred vision.  Respiratory: Negative.   Cardiovascular: Negative.   Gastrointestinal: Negative.    Genitourinary: Negative.   Musculoskeletal: Negative.   Skin: Negative.   Neurological: Positive for dizziness and headaches.  Endo/Heme/Allergies: Negative.   Psychiatric/Behavioral: Negative.     Family history- Review and unchanged Social history- Review and unchanged Physical Exam: BP 130/90 mmHg  Pulse 81  Temp(Src) 97.9 F (36.6 C) (Temporal)  Resp 16  Ht 5' 7.25" (1.708 m)  Wt 227 lb 9.6 oz (103.239 kg)  BMI 35.39 kg/m2  SpO2 95% Wt Readings from Last 3 Encounters:  02/28/16 227 lb 9.6 oz (103.239 kg)  08/23/15 229 lb (103.874 kg)  07/20/15 230 lb (104.327 kg)   General Appearance: Well nourished, in no apparent distress. Eyes: PERRLA, EOMs, conjunctiva no swelling or erythema Sinuses: No Frontal/maxillary tenderness ENT/Mouth: Ext aud canals clear, TMs without erythema, bulging. No erythema, swelling, or exudate on post pharynx.  Tonsils not swollen or erythematous. Hearing normal.  Neck: Supple, thyroid normal.  Respiratory: Respiratory effort normal, BS equal bilaterally without rales, rhonchi, wheezing or stridor.  Cardio: RRR with no MRGs. Brisk peripheral pulses without edema.  Abdomen: Soft, + BS, obese  Non tender, no guarding, rebound, hernias, masses. Lymphatics: Non tender without lymphadenopathy.  Musculoskeletal: Full ROM, 5/5 strength, normal gait.  Skin: Warm, dry without rashes, lesions, ecchymosis.  Neuro: Cranial nerves intact. Normal muscle tone, no cerebellar symptoms. Sensation intact.  Psych: Awake and oriented X 3, normal affect, Insight and Judgment appropriate.    Quentin Mulling, PA-C 9:01 AM [facility]

## 2016-02-28 NOTE — Patient Instructions (Signed)
We want weight loss that will last so you should lose 1-2 pounds a week.  THAT IS IT! Please pick THREE things a month to change. Once it is a habit check off the item. Then pick another three items off the list to become habits.  If you are already doing a habit on the list GREAT!  Cross that item off! o Don't drink your calories. Ie, alcohol, soda, fruit juice, and sweet tea.  o Drink more water. Drink a glass when you feel hungry or before each meal.  o Eat breakfast - Complex carb and protein (likeDannon light and fit yogurt, oatmeal, fruit, eggs, turkey bacon). o Measure your cereal.  Eat no more than one cup a day. (ie Kashi) o Eat an apple a day. o Add a vegetable a day. o Try a new vegetable a month. o Use Pam! Stop using oil or butter to cook. o Don't finish your plate or use smaller plates. o Share your dessert. o Eat sugar free Jello for dessert or frozen grapes. o Don't eat 2-3 hours before bed. o Switch to whole wheat bread, pasta, and brown rice. o Make healthier choices when you eat out. No fries! o Pick baked chicken, NOT fried. o Don't forget to SLOW DOWN when you eat. It is not going anywhere.  o Take the stairs. o Park far away in the parking lot o Lift soup cans (or weights) for 10 minutes while watching TV. o Walk at work for 10 minutes during break. o Walk outside 1 time a week with your friend, kids, dog, or significant other. o Start a walking group at church. o Walk the mall as much as you can tolerate.  o Keep a food diary. o Weigh yourself daily. o Walk for 15 minutes 3 days per week. o Cook at home more often and eat out less.  If life happens and you go back to old habits, it is okay.  Just start over. You can do it!   If you experience chest pain, get short of breath, or tired during the exercise, please stop immediately and inform your doctor.   Before you even begin to attack a weight-loss plan, it pays to remember this: You are not fat. You have fat.  Losing weight isn't about blame or shame; it's simply another achievement to accomplish. Dieting is like any other skill-you have to buckle down and work at it. As long as you act in a smart, reasonable way, you'll ultimately get where you want to be. Here are some weight loss pearls for you.  1. It's Not a Diet. It's a Lifestyle Thinking of a diet as something you're on and suffering through only for the short term doesn't work. To shed weight and keep it off, you need to make permanent changes to the way you eat. It's OK to indulge occasionally, of course, but if you cut calories temporarily and then revert to your old way of eating, you'll gain back the weight quicker than you can say yo-yo. Use it to lose it. Research shows that one of the best predictors of long-term weight loss is how many pounds you drop in the first month. For that reason, nutritionists often suggest being stricter for the first two weeks of your new eating strategy to build momentum. Cut out added sugar and alcohol and avoid unrefined carbs. After that, figure out how you can reincorporate them in a way that's healthy and maintainable.  2. There's a Right   Way to Exercise Working out burns calories and fat and boosts your metabolism by building muscle. But those trying to lose weight are notorious for overestimating the number of calories they burn and underestimating the amount they take in. Unfortunately, your system is biologically programmed to hold on to extra pounds and that means when you start exercising, your body senses the deficit and ramps up its hunger signals. If you're not diligent, you'll eat everything you burn and then some. Use it to lose it. Cardio gets all the exercise glory, but strength and interval training are the real heroes. They help you build lean muscle, which in turn increases your metabolism and calorie-burning ability 3. Don't Overreact to Mild Hunger Some people have a hard time losing weight because  of hunger anxiety. To them, being hungry is bad-something to be avoided at all costs-so they carry snacks with them and eat when they don't need to. Others eat because they're stressed out or bored. While you never want to get to the point of being ravenous (that's when bingeing is likely to happen), a hunger pang, a craving, or the fact that it's 3:00 p.m. should not send you racing for the vending machine or obsessing about the energy bar in your purse. Ideally, you should put off eating until your stomach is growling and it's difficult to concentrate.  Use it to lose it. When you feel the urge to eat, use the HALT method. Ask yourself, Am I really hungry? Or am I angry or anxious, lonely or bored, or tired? If you're still not certain, try the apple test. If you're truly hungry, an apple should seem delicious; if it doesn't, something else is going on. Or you can try drinking water and making yourself busy, if you are still hungry try a healthy snack.  4. Not All Calories Are Created Equal The mechanics of weight loss are pretty simple: Take in fewer calories than you use for energy. But the kind of food you eat makes all the difference. Processed food that's high in saturated fat and refined starch or sugar can cause inflammation that disrupts the hormone signals that tell your brain you're full. The result: You eat a lot more.  Use it to lose it. Clean up your diet. Swap in whole, unprocessed foods, including vegetables, lean protein, and healthy fats that will fill you up and give you the biggest nutritional bang for your calorie buck. In a few weeks, as your brain starts receiving regular hunger and fullness signals once again, you'll notice that you feel less hungry overall and naturally start cutting back on the amount you eat.  5. Protein, Produce, and Plant-Based Fats Are Your Weight-Loss Trinity Here's why eating the three Ps regularly will help you drop pounds. Protein fills you up. You need it  to build lean muscle, which keeps your metabolism humming so that you can torch more fat. People in a weight-loss program who ate double the recommended daily allowance for protein (about 110 grams for a 150-pound woman) lost 70 percent of their weight from fat, while people who ate the RDA lost only about 40 percent, one study found. Produce is packed with filling fiber. "It's very difficult to consume too many calories if you're eating a lot of vegetables. Example: Three cups of broccoli is a lot of food, yet only 93 calories. (Fruit is another story. It can be easy to overeat and can contain a lot of calories from sugar, so be sure to   monitor your intake.) Plant-based fats like olive oil and those in avocados and nuts are healthy and extra satiating.  Use it to lose it. Aim to incorporate each of the three Ps into every meal and snack. People who eat protein throughout the day are able to keep weight off, according to a study in the American Journal of Clinical Nutrition. In addition to meat, poultry and seafood, good sources are beans, lentils, eggs, tofu, and yogurt. As for fat, keep portion sizes in check by measuring out salad dressing, oil, and nut butters (shoot for one to two tablespoons). Finally, eat veggies or a little fruit at every meal. People who did that consumed 308 fewer calories but didn't feel any hungrier than when they didn't eat more produce.  7. How You Eat Is As Important As What You Eat In order for your brain to register that you're full, you need to focus on what you're eating. Sit down whenever you eat, preferably at a table. Turn off the TV or computer, put down your phone, and look at your food. Smell it. Chew slowly, and don't put another bite on your fork until you swallow. When women ate lunch this attentively, they consumed 30 percent less when snacking later than those who listened to an audiobook at lunchtime, according to a study in the British Journal of Nutrition. 8.  Weighing Yourself Really Works The scale provides the best evidence about whether your efforts are paying off. Seeing the numbers tick up or down or stagnate is motivation to keep going-or to rethink your approach. A 2015 study at [employer] found that daily weigh-ins helped people lose more weight, keep it off, and maintain that loss, even after two years. Use it to lose it. Step on the scale at the same time every day for the best results. If your weight shoots up several pounds from one weigh-in to the next, don't freak out. Eating a lot of salt the night before or having your period is the likely culprit. The number should return to normal in a day or two. It's a steady climb that you need to do something about. 9. Too Much Stress and Too Little Sleep Are Your Enemies When you're tired and frazzled, your body cranks up the production of cortisol, the stress hormone that can cause carb cravings. Not getting enough sleep also boosts your levels of ghrelin, a hormone associated with hunger, while suppressing leptin, a hormone that signals fullness and satiety. People on a diet who slept only five and a half hours a night for two weeks lost 55 percent less fat and were hungrier than those who slept eight and a half hours, according to a study in the [employer] Medical Association Journal. Use it to lose it. Prioritize sleep, aiming for seven hours or more a night, which research shows helps lower stress. And make sure you're getting quality zzz's. If a snoring spouse or a fidgety cat wakes you up frequently throughout the night, you may end up getting the equivalent of just four hours of sleep, according to a study from [employer]. Keep pets out of the bedroom, and use a white-noise app to drown out snoring. 10. You Will Hit a plateau-And You Can Bust Through It As you slim down, your body releases much less leptin, the fullness hormone.  If you're not strength training, start right now.  Building muscle can raise your metabolism to help you overcome a plateau. To keep your body challenged   and burning calories, incorporate new moves and more intense intervals into your workouts or add another sweat session to your weekly routine. Alternatively, cut an extra 100 calories or so a day from your diet. Now that you've lost weight, your body simply doesn't need as much fuel.   Fatigue Fatigue is feeling tired all of the time, a lack of energy, or a lack of motivation. Occasional or mild fatigue is often a normal response to activity or life in general. However, long-lasting (chronic) or extreme fatigue may indicate an underlying medical condition. HOME CARE INSTRUCTIONS  Watch your fatigue for any changes. The following actions may help to lessen any discomfort you are feeling:  Talk to your health care provider about how much sleep you need each night. Try to get the required amount every night.  Take medicines only as directed by your health care provider.  Eat a healthy and nutritious diet. Ask your health care provider if you need help changing your diet.  Drink enough fluid to keep your urine clear or pale yellow.  Practice ways of relaxing, such as yoga, meditation, massage therapy, or acupuncture.  Exercise regularly.   Change situations that cause you stress. Try to keep your work and personal routine reasonable.  Do not abuse illegal drugs.  Limit alcohol intake to no more than 1 drink per day for nonpregnant women and 2 drinks per day for men. One drink equals 12 ounces of beer, 5 ounces of wine, or 1 ounces of hard liquor.  Take a multivitamin, if directed by your health care provider. SEEK MEDICAL CARE IF:   Your fatigue does not get better.  You have a fever.   You have unintentional weight loss or gain.  You have headaches.   You have difficulty:   Falling asleep.  Sleeping throughout the night.  You feel angry, guilty, anxious, or sad.   You  are unable to have a bowel movement (constipation).   You skin is dry.   Your legs or another part of your body is swollen.  SEEK IMMEDIATE MEDICAL CARE IF:   You feel confused.   Your vision is blurry.  You feel faint or pass out.   You have a severe headache.   You have severe abdominal, pelvic, or back pain.   You have chest pain, shortness of breath, or an irregular or fast heartbeat.   You are unable to urinate or you urinate less than normal.   You develop abnormal bleeding, such as bleeding from the rectum, vagina, nose, lungs, or nipples.  You vomit blood.   You have thoughts about harming yourself or committing suicide.   You are worried that you might harm someone else.    This information is not intended to replace advice given to you by your health care provider. Make sure you discuss any questions you have with your health care provider.   Document Released: 10/13/2007 Document Revised: 01/06/2015 Document Reviewed: 04/19/2014 Elsevier Interactive Patient Education [employer]

## 2016-02-29 LAB — URINALYSIS, ROUTINE W REFLEX MICROSCOPIC
BILIRUBIN URINE: NEGATIVE
Glucose, UA: NEGATIVE
KETONES UR: NEGATIVE
Leukocytes, UA: NEGATIVE
NITRITE: NEGATIVE
PH: 6.5 (ref 5.0–8.0)
Protein, ur: NEGATIVE
SPECIFIC GRAVITY, URINE: 1.016 (ref 1.001–1.035)

## 2016-02-29 LAB — URINE CULTURE
Colony Count: NO GROWTH
ORGANISM ID, BACTERIA: NO GROWTH

## 2016-02-29 LAB — URINALYSIS, MICROSCOPIC ONLY
Bacteria, UA: NONE SEEN [HPF]
CASTS: NONE SEEN [LPF]
Crystals: NONE SEEN [HPF]
SQUAMOUS EPITHELIAL / LPF: NONE SEEN [HPF] (ref <=5)
WBC UA: NONE SEEN WBC/HPF (ref <=5)
Yeast: NONE SEEN [HPF]

## 2016-02-29 LAB — FERRITIN: FERRITIN: 126 ng/mL (ref 10–232)

## 2016-02-29 LAB — VITAMIN D 25 HYDROXY (VIT D DEFICIENCY, FRACTURES): Vit D, 25-Hydroxy: 47 ng/mL (ref 30–100)

## 2016-02-29 LAB — VITAMIN B12: VITAMIN B 12: 422 pg/mL (ref 200–1100)

## 2016-02-29 LAB — TSH: TSH: 1.97 mIU/L

## 2016-04-10 ENCOUNTER — Encounter: Payer: Self-pay | Admitting: Physician Assistant

## 2016-04-10 ENCOUNTER — Ambulatory Visit (INDEPENDENT_AMBULATORY_CARE_PROVIDER_SITE_OTHER): Payer: BLUE CROSS/BLUE SHIELD | Admitting: Physician Assistant

## 2016-04-10 VITALS — BP 130/88 | HR 73 | Temp 97.5°F | Resp 16 | Ht 67.25 in | Wt 226.2 lb

## 2016-04-10 DIAGNOSIS — I10 Essential (primary) hypertension: Secondary | ICD-10-CM | POA: Diagnosis not present

## 2016-04-10 DIAGNOSIS — E669 Obesity, unspecified: Secondary | ICD-10-CM | POA: Diagnosis not present

## 2016-04-10 DIAGNOSIS — F329 Major depressive disorder, single episode, unspecified: Secondary | ICD-10-CM

## 2016-04-10 DIAGNOSIS — R350 Frequency of micturition: Secondary | ICD-10-CM

## 2016-04-10 DIAGNOSIS — F32A Depression, unspecified: Secondary | ICD-10-CM

## 2016-04-10 MED ORDER — BUPROPION HCL ER (XL) 150 MG PO TB24: 150.0000 mg | ORAL_TABLET | ORAL | Status: DC

## 2016-04-10 MED ORDER — PROMETHAZINE HCL 25 MG PO TABS: 25.0000 mg | ORAL_TABLET | Freq: Four times a day (QID) | ORAL | Status: DC | PRN

## 2016-04-10 MED ORDER — AZITHROMYCIN 250 MG PO TABS
ORAL_TABLET | ORAL | Status: DC
Start: 2016-04-10 — End: 2016-08-22

## 2016-04-10 MED ORDER — PREDNISONE 20 MG PO TABS: ORAL_TABLET | ORAL | Status: DC

## 2016-04-10 MED ORDER — HYOSCYAMINE SULFATE 0.125 MG SL SUBL: 0.1250 mg | SUBLINGUAL_TABLET | SUBLINGUAL | Status: DC | PRN

## 2016-04-10 MED ORDER — CIPROFLOXACIN HCL 500 MG PO TABS: 500.0000 mg | ORAL_TABLET | Freq: Two times a day (BID) | ORAL | Status: DC

## 2016-04-10 NOTE — Patient Instructions (Addendum)
  If you are traveling you can take these medications to be more prepared. If you get chest pain, shortness of breath or abdominal pain please go to the hospital wherever you may be.   Ciprofloxacin is good for travelers diarrhea, you can take 2 pills a day for 7 days. Or it is also good for urinary tract infections, you can take 2 a day for 7 days.  Zpak- is good for sinus infections please finish as prescribed Phenergran is for nausea but it sedating so plan on eating and sleeping.  Levsin is good for nausea, diarrhea, or abdominal cramping- it can constipate you so don't take too much. This dissolves under your tongue.  Prednisone is good for joint pain or rashes or spider bites- you can take it as prescribed but if you are feeling better you can stop it early.   

## 2016-04-10 NOTE — Progress Notes (Signed)
Assessment and Plan: Blood in urine- will get urine.  Depression/anxiety- continue wellbutrin Traveling on cruise- will give travel meds.   Future Appointments Date Time Provider Department Center  08/22/2016 2:00 PM Quentin MullingAmanda Velma Hanna, PA-C [facility] None    HPI 60 y.o.female presents for 1 month follow up. Last visit she had some depression, wellbutrin was added and she was suggested to start counseling. In addition her BP was elevated.  BP Readings from Last 3 Encounters:  04/10/16 130/88  02/28/16 130/90  08/23/15 146/96   Wt Readings from Last 3 Encounters:  04/10/16 226 lb 3.2 oz (102.604 kg)  02/28/16 227 lb 9.6 oz (103.239 kg)  08/23/15 229 lb (103.874 kg)    Past Medical History  Diagnosis Date  . Hypertension   . Depression   . Shingles     chest-right  . Obesity   . Kidney stones 11-2011  . Vitamin D deficiency      No Known Allergies    Current Outpatient Prescriptions on File Prior to Visit  Medication Sig Dispense Refill  . atenolol (TENORMIN) 50 MG tablet Take 1 tablet (50 mg total) by mouth daily. 90 tablet 1  . buPROPion (WELLBUTRIN XL) 150 MG 24 hr tablet Take 1 tablet (150 mg total) by mouth every morning. 30 tablet 2  . hydrochlorothiazide (HYDRODIURIL) 25 MG tablet Take 1 tablet (25 mg total) by mouth daily. 90 tablet 0  . Vitamin D, Ergocalciferol, (DRISDOL) 50000 UNITS CAPS capsule Take 1 capsule (50,000 Units total) by mouth daily. 30 capsule 2   No current facility-administered medications on file prior to visit.    ROS: all negative except above.   Physical Exam: Filed Weights   04/10/16 1537  Weight: 226 lb 3.2 oz (102.604 kg)   BP 130/88 mmHg  Pulse 73  Temp(Src) 97.5 F (36.4 C) (Temporal)  Resp 16  Ht 5' 7.25" (1.708 m)  Wt 226 lb 3.2 oz (102.604 kg)  BMI 35.17 kg/m2  SpO2 98% General Appearance: Well nourished, in no apparent distress. Eyes: PERRLA, EOMs, conjunctiva no swelling or erythema Sinuses: No Frontal/maxillary  tenderness ENT/Mouth: Ext aud canals clear, TMs without erythema, bulging. No erythema, swelling, or exudate on post pharynx.  Tonsils not swollen or erythematous. Hearing normal.  Neck: Supple, thyroid normal.  Respiratory: Respiratory effort normal, BS equal bilaterally without rales, rhonchi, wheezing or stridor.  Cardio: RRR with no MRGs. Brisk peripheral pulses without edema.  Abdomen: Soft, + BS.  Non tender, no guarding, rebound, hernias, masses. Lymphatics: Non tender without lymphadenopathy.  Musculoskeletal: Full ROM, 5/5 strength, normal gait.  Skin: Warm, dry without rashes, lesions, ecchymosis.  Neuro: Cranial nerves intact. Normal muscle tone, no cerebellar symptoms. Sensation intact.  Psych: Awake and oriented X 3, normal affect, Insight and Judgment appropriate.     Quentin MullingAmanda Cherlynn Popiel, PA-C 3:51 PM [facility]

## 2016-04-11 LAB — URINALYSIS, ROUTINE W REFLEX MICROSCOPIC
Bilirubin Urine: NEGATIVE
Glucose, UA: NEGATIVE
KETONES UR: NEGATIVE
LEUKOCYTES UA: NEGATIVE
NITRITE: NEGATIVE
PROTEIN: NEGATIVE
Specific Gravity, Urine: 1.013 (ref 1.001–1.035)
pH: 6 (ref 5.0–8.0)

## 2016-04-11 LAB — URINALYSIS, MICROSCOPIC ONLY
BACTERIA UA: NONE SEEN [HPF]
Casts: NONE SEEN [LPF]
Crystals: NONE SEEN [HPF]
SQUAMOUS EPITHELIAL / LPF: NONE SEEN [HPF] (ref <=5)
WBC UA: NONE SEEN WBC/HPF (ref <=5)
Yeast: NONE SEEN [HPF]

## 2016-04-12 LAB — URINE CULTURE

## 2016-05-08 ENCOUNTER — Other Ambulatory Visit: Payer: Self-pay | Admitting: *Deleted

## 2016-05-08 MED ORDER — BUPROPION HCL ER (XL) 150 MG PO TB24: 150.0000 mg | ORAL_TABLET | ORAL | Status: DC

## 2016-07-14 ENCOUNTER — Other Ambulatory Visit: Payer: Self-pay | Admitting: Physician Assistant

## 2016-08-22 ENCOUNTER — Ambulatory Visit (INDEPENDENT_AMBULATORY_CARE_PROVIDER_SITE_OTHER): Payer: BLUE CROSS/BLUE SHIELD | Admitting: Physician Assistant

## 2016-08-22 ENCOUNTER — Encounter: Payer: Self-pay | Admitting: Physician Assistant

## 2016-08-22 ENCOUNTER — Other Ambulatory Visit: Payer: Self-pay

## 2016-08-22 VITALS — BP 130/90 | HR 69 | Temp 97.3°F | Resp 16 | Ht 67.5 in | Wt 232.6 lb

## 2016-08-22 DIAGNOSIS — M5030 Other cervical disc degeneration, unspecified cervical region: Secondary | ICD-10-CM

## 2016-08-22 DIAGNOSIS — E559 Vitamin D deficiency, unspecified: Secondary | ICD-10-CM

## 2016-08-22 DIAGNOSIS — R6889 Other general symptoms and signs: Secondary | ICD-10-CM | POA: Diagnosis not present

## 2016-08-22 DIAGNOSIS — Z87442 Personal history of urinary calculi: Secondary | ICD-10-CM | POA: Diagnosis not present

## 2016-08-22 DIAGNOSIS — I10 Essential (primary) hypertension: Secondary | ICD-10-CM | POA: Diagnosis not present

## 2016-08-22 DIAGNOSIS — F325 Major depressive disorder, single episode, in full remission: Secondary | ICD-10-CM

## 2016-08-22 DIAGNOSIS — Z136 Encounter for screening for cardiovascular disorders: Secondary | ICD-10-CM

## 2016-08-22 DIAGNOSIS — E785 Hyperlipidemia, unspecified: Secondary | ICD-10-CM

## 2016-08-22 DIAGNOSIS — N62 Hypertrophy of breast: Secondary | ICD-10-CM | POA: Insufficient documentation

## 2016-08-22 DIAGNOSIS — R7303 Prediabetes: Secondary | ICD-10-CM | POA: Diagnosis not present

## 2016-08-22 DIAGNOSIS — Z1211 Encounter for screening for malignant neoplasm of colon: Secondary | ICD-10-CM

## 2016-08-22 DIAGNOSIS — Z79899 Other long term (current) drug therapy: Secondary | ICD-10-CM

## 2016-08-22 DIAGNOSIS — Z23 Encounter for immunization: Secondary | ICD-10-CM | POA: Diagnosis not present

## 2016-08-22 DIAGNOSIS — Z0001 Encounter for general adult medical examination with abnormal findings: Secondary | ICD-10-CM

## 2016-08-22 LAB — BASIC METABOLIC PANEL WITH GFR
BUN: 11 mg/dL (ref 7–25)
CO2: 28 mmol/L (ref 20–31)
CREATININE: 0.93 mg/dL (ref 0.50–0.99)
Calcium: 9.5 mg/dL (ref 8.6–10.4)
Chloride: 98 mmol/L (ref 98–110)
GFR, EST AFRICAN AMERICAN: 77 mL/min (ref >=60)
GFR, Est Non African American: 67 mL/min (ref >=60)
Glucose, Bld: 73 mg/dL (ref 65–99)
POTASSIUM: 3.5 mmol/L (ref 3.5–5.3)
SODIUM: 139 mmol/L (ref 135–146)

## 2016-08-22 LAB — HEPATIC FUNCTION PANEL
ALBUMIN: 3.8 g/dL (ref 3.6–5.1)
ALK PHOS: 78 U/L (ref 33–130)
ALT: 12 U/L (ref 6–29)
AST: 15 U/L (ref 10–35)
BILIRUBIN DIRECT: 0.1 mg/dL (ref <=0.2)
BILIRUBIN TOTAL: 0.6 mg/dL (ref 0.2–1.2)
Indirect Bilirubin: 0.5 mg/dL (ref 0.2–1.2)
Total Protein: 6.9 g/dL (ref 6.1–8.1)

## 2016-08-22 LAB — CBC WITH DIFFERENTIAL/PLATELET
BASOS PCT: 0 %
Basophils Absolute: 0 cells/uL (ref 0–200)
EOS ABS: 206 {cells}/uL (ref 15–500)
EOS PCT: 2 %
HCT: 38.6 % (ref 35.0–45.0)
Hemoglobin: 13.2 g/dL (ref 11.7–15.5)
LYMPHS PCT: 43 %
Lymphs Abs: 4429 cells/uL — ABNORMAL HIGH (ref 850–3900)
MCH: 28.1 pg (ref 27.0–33.0)
MCHC: 34.2 g/dL (ref 32.0–36.0)
MCV: 82.1 fL (ref 80.0–100.0)
MONOS PCT: 6 %
MPV: 10.2 fL (ref 7.5–12.5)
Monocytes Absolute: 618 cells/uL (ref 200–950)
Neutro Abs: 5047 cells/uL (ref 1500–7800)
Neutrophils Relative %: 49 %
PLATELETS: 317 10*3/uL (ref 140–400)
RBC: 4.7 MIL/uL (ref 3.80–5.10)
RDW: 12.9 % (ref 11.0–15.0)
WBC: 10.3 10*3/uL (ref 3.8–10.8)

## 2016-08-22 LAB — LIPID PANEL
CHOL/HDL RATIO: 2.6 ratio (ref <=5.0)
Cholesterol: 140 mg/dL (ref 125–200)
HDL: 54 mg/dL (ref >=46)
LDL CALC: 72 mg/dL (ref <130)
TRIGLYCERIDES: 68 mg/dL (ref <150)
VLDL: 14 mg/dL (ref <30)

## 2016-08-22 LAB — TSH: TSH: 1.84 mIU/L

## 2016-08-22 LAB — MAGNESIUM: Magnesium: 1.8 mg/dL (ref 1.5–2.5)

## 2016-08-22 MED ORDER — BUPROPION HCL ER (XL) 150 MG PO TB24: 300.0000 mg | ORAL_TABLET | ORAL | 5 refills | Status: DC

## 2016-08-22 MED ORDER — BUPROPION HCL ER (XL) 150 MG PO TB24: 300.0000 mg | ORAL_TABLET | ORAL | 3 refills | Status: DC

## 2016-08-22 NOTE — Progress Notes (Signed)
Complete Physical  Assessment and Plan: Essential hypertension -- increase atenolol to 50mg  daily, take HCTZ, PRN DASH diet, exercise and monitor at home. Call if greater than 130/80.  - EKG   Depression Remission, increase wellbutrin to 300mg  daily for weight loss and depression  Vitamin D deficiency Check level Continue supplement, take more regularly  Routine general medical examination at a health care facility - CBC with Differential - BASIC METABOLIC PANEL WITH GFR - Hepatic function panel - Lipid panel - TSH - Hemoglobin A1c - Insulin, fasting - Vit D  25 hydroxy (rtn osteoporosis monitoring) - Urinalysis, Routine w reflex microscopic - Microalbumin / creatinine urine ratio - Vitamin B12 - Magnesium - Iron and TIBC - Ferritin - EKG 12-Lead  Morbid Obesity, Class II, BMI 35-39.9, with comorbidity  long discussion about weight loss, diet, and exercise  Neck pain on right side/right shoulder pain Continue to do NSAIDS, has had ESI with continuing neck pain possibly large breast contribute to pain, will work on weight loss and if continue neck pain, yeast cream, injections will try for reduction.   Other abnormal glucose Discussed general issues about diabetes pathophysiology and management., Educational material distributed., Suggested low cholesterol diet., Encouraged aerobic exercise., Discussed foot care., Reminded to get yearly retinal exam.  Kidney stone history Continue follow up, increase fluids  Colon cancer screen Will send for over due screening colonoscopy  Breast Hypertrophy Will send to surgeon for evaluation.   Discussed med's effects and SE's. Screening labs and tests as requested with regular follow-up as recommended.  HPI 60 y.o. female  presents for a complete physical.  Her blood pressure has not been controlled at home, today their BP is BP: 130/90. She is on atenolol 25mg  daily but only takes HCTZ PRN.  She does workout, she walks. She  denies chest pain, shortness of breath, dizziness She is not on cholesterol medication and denies myalgias. Her cholesterol is at goal. The cholesterol last visit was:   Lab Results  Component Value Date   CHOL 143 02/28/2016   HDL 50 02/28/2016   LDLCALC 80 02/28/2016   TRIG 65 02/28/2016   CHOLHDL 2.9 02/28/2016   She has preDM and elevated insulin due to obesity, denies DM polys:  Lab Results  Component Value Date   HGBA1C 5.6 02/28/2016   Patient is on Vitamin D supplement.   Lab Results  Component Value Date   VD25OH 47 02/28/2016     Has recurrent kidney stones, on maxide, [place]  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 She has bilateral shoulder indentations and occ yeast under her breast during the summer. She has to wear two bras for support and has been unable to workout due to pain from her neck.  She BMI is Body mass index is 35.89 kg/m., she is working on diet and exercise and has done well.  Wt Readings from Last 3 Encounters:  08/22/16 232 lb 9.6 oz (105.5 kg)  04/10/16 226 lb 3.2 oz (102.6 kg)  02/28/16 227 lb 9.6 oz (103.2 kg)    Current Medications:  Current Outpatient Prescriptions on File Prior to Visit  Medication Sig Dispense Refill  . atenolol (TENORMIN) 50 MG tablet TAKE 1 TABLET (50 MG TOTAL) BY MOUTH DAILY. 90 tablet 0  . buPROPion (WELLBUTRIN XL) 150 MG 24 hr tablet Take 1 tablet (150 mg total) by mouth every morning. 90 tablet 0  . hydrochlorothiazide (  HYDRODIURIL) 25 MG tablet Take 1 tablet (25 mg total) by mouth daily. 90 tablet 0  . Vitamin D, Ergocalciferol, (DRISDOL) 50000 UNITS CAPS capsule Take 1 capsule (50,000 Units total) by mouth daily. 30 capsule 2   No current facility-administered medications on file prior to visit.    Health Maintenance:    There is no immunization history on file for this patient.  Tetanus: 2007 DUE Pneumovax: 2000 Prevnar 13: due age  4 Flu vaccine: 2014 Zostavax: Pap:2010 s/p hysterectomy MGM: 07/2014 DUE DEXA: Colonoscopy: DUE EGD: Pelvis US 2008 Ct AB pelvis 04/06/2014  Patient Care Team: Lucky Cowboy, MD as PCP - General (Internal Medicine) Dr. Benancio Deeds, urologist  Medical History:  Past Medical History:  Diagnosis Date  . Depression   . Hypertension   . Kidney stones 11-2011  . Obesity   . Shingles    chest-right  . Vitamin D deficiency    Allergies No Known Allergies  SURGICAL HISTORY She  has a past surgical history that includes Lithotripsy; Ear Cyst Excision (Left); and Abdominal hysterectomy. FAMILY HISTORY Her family history includes Cancer in her mother; Diabetes in her father; Hyperlipidemia in her father; Hypertension in her mother; Stroke in her father. SOCIAL HISTORY She  reports that she has never smoked. She does not have any smokeless tobacco history on file. She reports that she does not drink alcohol or use drugs.  Review of Systems  Constitutional: Negative.   HENT: Negative for ear pain and hearing loss.   Eyes: Negative.   Respiratory: Negative.   Cardiovascular: Negative.  Negative for chest pain and palpitations.  Gastrointestinal: Negative.   Genitourinary: Negative.   Musculoskeletal: Positive for neck pain.  Skin: Positive for rash (under her breast, worst during the summer. ).  Neurological: Negative.   Endo/Heme/Allergies: Negative.   Psychiatric/Behavioral: Negative.    Physical Exam: Estimated body mass index is 35.89 kg/m as calculated from the following:   Height as of this encounter: 5' 7.5" (1.715 m).   Weight as of this encounter: 232 lb 9.6 oz (105.5 kg). BP 130/90   Pulse 69   Temp 97.3 F (36.3 C)   Resp 16   Ht 5' 7.5" (1.715 m)   Wt 232 lb 9.6 oz (105.5 kg)   SpO2 96%   BMI 35.89 kg/m  General Appearance: Well nourished, in no apparent distress. Eyes: PERRLA, EOMs, conjunctiva no swelling or erythema, normal fundi and vessels. Sinuses:  No Frontal/maxillary tenderness ENT/Mouth: Ext aud canals clear, normal light reflex with TMs without erythema, bulging.  Good dentition. No erythema, swelling, or exudate on post pharynx. Tonsils not swollen or erythematous. Hearing normal.  Neck: Supple, thyroid normal. No bruits Respiratory: Respiratory effort normal, BS equal bilaterally without rales, rhonchi, wheezing or stridor. Cardio: RRR without murmurs, rubs or gallops. Brisk peripheral pulses without edema.  Chest: symmetric, with normal excursions and percussion. Breasts: Large pendulous breast. Symmetric, without lumps, nipple discharge, retractions. Abdomen: Soft, +BS. Non tender, no guarding, rebound, hernias, masses, or organomegaly. .  Lymphatics: Non tender without lymphadenopathy.  Genitourinary: defer Musculoskeletal: Full ROM all peripheral extremities,5/5 strength, and normal gait. Skin: Warm, dry without rashes, lesions, ecchymosis.  Neuro: Cranial nerves intact, reflexes equal bilaterally. Normal muscle tone, no cerebellar symptoms. Sensation intact.  Psych: Awake and oriented X 3, normal affect, Insight and Judgment appropriate.   EKG: WNL no changes. AORTA SCAN: defer   Quentin Mulling 2:23 PM

## 2016-08-22 NOTE — Patient Instructions (Signed)
[facility]  7 a.m.-6:30 p.m., Monday 7 a.m.-5 p.m., Tuesday-Friday Schedule an appointment by calling [phone].  Encourage you to get the 3D Mammogram  The 3D Mammogram is much more specific and sensitive to pick up breast cancer. For women with fibrocystic breast or lumpy breast it can be hard to determine if it is cancer or not but the 3D mammogram is able to tell this difference which cuts back on unneeded additional tests or scary call backs.   - over 40% increase in detection of breast cancer - over 40% reduction in false positives.  - fewer call backs - reduced anxiety - improved outcomes - [place] [employer]   Colon cancer is 3rd most diagnosed cancer and 2nd leading cause of death in both men and women 60 years of age and older despite being one of the most preventable and treatable cancers if found early.  4 of out 5 people diagnosed with colon cancer have NO prior family history.  When caught EARLY 90% of colon cancer is curable.

## 2016-08-23 LAB — URINALYSIS, ROUTINE W REFLEX MICROSCOPIC
BILIRUBIN URINE: NEGATIVE
GLUCOSE, UA: NEGATIVE
KETONES UR: NEGATIVE
Leukocytes, UA: NEGATIVE
Nitrite: NEGATIVE
PROTEIN: NEGATIVE
SPECIFIC GRAVITY, URINE: 1.006 (ref 1.001–1.035)
pH: 7 (ref 5.0–8.0)

## 2016-08-23 LAB — VITAMIN D 25 HYDROXY (VIT D DEFICIENCY, FRACTURES): VIT D 25 HYDROXY: 33 ng/mL (ref 30–100)

## 2016-08-23 LAB — HEMOGLOBIN A1C
HEMOGLOBIN A1C: 5.5 % (ref <5.7)
Mean Plasma Glucose: 111 mg/dL

## 2016-08-23 LAB — URINALYSIS, MICROSCOPIC ONLY
Bacteria, UA: NONE SEEN [HPF]
CASTS: NONE SEEN [LPF]
Crystals: NONE SEEN [HPF]
RBC / HPF: NONE SEEN RBC/HPF (ref <=2)
SQUAMOUS EPITHELIAL / LPF: NONE SEEN [HPF] (ref <=5)
WBC UA: NONE SEEN WBC/HPF (ref <=5)
Yeast: NONE SEEN [HPF]

## 2016-08-23 LAB — MICROALBUMIN / CREATININE URINE RATIO
Creatinine, Urine: 34 mg/dL (ref 20–320)
Microalb Creat Ratio: 6 mcg/mg creat (ref <30)
Microalb, Ur: 0.2 mg/dL

## 2016-08-27 ENCOUNTER — Encounter: Payer: Self-pay | Admitting: Internal Medicine

## 2016-10-11 ENCOUNTER — Other Ambulatory Visit: Payer: Self-pay | Admitting: Internal Medicine

## 2016-10-17 ENCOUNTER — Other Ambulatory Visit: Payer: Self-pay | Admitting: Physician Assistant

## 2016-10-17 DIAGNOSIS — Z1231 Encounter for screening mammogram for malignant neoplasm of breast: Secondary | ICD-10-CM

## 2016-10-18 ENCOUNTER — Ambulatory Visit
Admission: RE | Admit: 2016-10-18 | Discharge: 2016-10-18 | Disposition: A | Discharge status: Home / Self Care | Payer: BLUE CROSS/BLUE SHIELD | Source: Ambulatory Visit | Attending: Physician Assistant | Admitting: Physician Assistant

## 2016-10-18 DIAGNOSIS — Z1231 Encounter for screening mammogram for malignant neoplasm of breast: Secondary | ICD-10-CM

## 2016-10-24 ENCOUNTER — Other Ambulatory Visit: Payer: Self-pay | Admitting: Physician Assistant

## 2016-10-30 HISTORY — PX: BREAST REDUCTION SURGERY: SHX8

## 2016-11-13 ENCOUNTER — Ambulatory Visit: Payer: Self-pay | Admitting: Physician Assistant

## 2016-11-20 ENCOUNTER — Other Ambulatory Visit: Payer: Self-pay | Admitting: Physician Assistant

## 2016-11-25 ENCOUNTER — Ambulatory Visit (INDEPENDENT_AMBULATORY_CARE_PROVIDER_SITE_OTHER): Payer: BLUE CROSS/BLUE SHIELD | Admitting: Physician Assistant

## 2016-11-25 ENCOUNTER — Encounter: Payer: Self-pay | Admitting: Physician Assistant

## 2016-11-25 VITALS — BP 144/88 | HR 68 | Temp 98.2°F | Resp 18 | Ht 67.5 in | Wt 230.0 lb

## 2016-11-25 DIAGNOSIS — E559 Vitamin D deficiency, unspecified: Secondary | ICD-10-CM

## 2016-11-25 DIAGNOSIS — I10 Essential (primary) hypertension: Secondary | ICD-10-CM

## 2016-11-25 DIAGNOSIS — R7303 Prediabetes: Secondary | ICD-10-CM

## 2016-11-25 DIAGNOSIS — Z79899 Other long term (current) drug therapy: Secondary | ICD-10-CM | POA: Diagnosis not present

## 2016-11-25 DIAGNOSIS — E785 Hyperlipidemia, unspecified: Secondary | ICD-10-CM

## 2016-11-25 NOTE — Patient Instructions (Signed)
Monitor BP at home If BP is over 140/90 can take atenolol 50mg  twice daily  Monitor your blood pressure at home. Go to the ER if any CP, SOB, nausea, dizziness, severe HA, changes vision/speech  Goal BP:  For patients younger than 60: Goal BP < 140/90. For patients 60 and older: Goal BP < 150/90. For patients with diabetes: Goal BP < 140/90. Your most recent BP: BP: (!) 144/88   Take your medications faithfully as instructed. Maintain a healthy weight. Get at least 150 minutes of aerobic exercise per week. Minimize salt intake. Minimize alcohol intake  DASH Eating Plan DASH stands for "Dietary Approaches to Stop Hypertension." The DASH eating plan is a healthy eating plan that has been shown to reduce high blood pressure (hypertension). Additional health benefits may include reducing the risk of type 2 diabetes mellitus, heart disease, and stroke. The DASH eating plan may also help with weight loss. WHAT DO I NEED TO KNOW ABOUT THE DASH EATING PLAN? For the DASH eating plan, you will follow these general guidelines:  Choose foods with a percent daily value for sodium of less than 5% (as listed on the food label).  Use salt-free seasonings or herbs instead of table salt or sea salt.  Check with your health care provider or pharmacist before using salt substitutes.  Eat lower-sodium products, often labeled as "lower sodium" or "no salt added."  Eat fresh foods.  Eat more vegetables, fruits, and low-fat dairy products.  Choose whole grains. Look for the word "whole" as the first word in the ingredient list.  Choose fish and skinless chicken or [place] more often than red meat. Limit fish, poultry, and meat to 6 oz (170 g) each day.  Limit sweets, desserts, sugars, and sugary drinks.  Choose heart-healthy fats.  Limit cheese to 1 oz (28 g) per day.  Eat more home-cooked food and less restaurant, buffet, and fast food.  Limit fried foods.  Cook foods using methods other than  frying.  Limit canned vegetables. If you do use them, rinse them well to decrease the sodium.  When eating at a restaurant, ask that your food be prepared with less salt, or no salt if possible. WHAT FOODS CAN I EAT? Seek help from a dietitian for individual calorie needs. Grains Whole grain or whole wheat bread. Brown rice. Whole grain or whole wheat pasta. Quinoa, bulgur, and whole grain cereals. Low-sodium cereals. Corn or whole wheat flour tortillas. Whole grain cornbread. Whole grain crackers. Low-sodium crackers. Vegetables Fresh or frozen vegetables (raw, steamed, roasted, or grilled). Low-sodium or reduced-sodium tomato and vegetable juices. Low-sodium or reduced-sodium tomato sauce and paste. Low-sodium or reduced-sodium canned vegetables.  Fruits All fresh, canned (in natural juice), or frozen fruits. Meat and Other Protein Products Ground beef (85% or leaner), grass-fed beef, or beef trimmed of fat. Skinless chicken or [place]key. Ground chicken or [place]urkey. Pork trimmed of fat. All fish and seafood. Eggs. Dried beans, peas, or lentils. Unsalted nuts and seeds. Unsalted canned beans. Dairy Low-fat dairy products, such as skim or 1% milk, 2% or reduced-fat cheeses, low-fat ricotta or cottage cheese, or plain low-fat yogurt. Low-sodium or reduced-sodium cheeses. Fats and Oils Tub margarines without trans fats. Light or reduced-fat mayonnaise and salad dressings (reduced sodium). Avocado. Safflower, olive, or canola oils. Natural peanut or almond butter. Other Unsalted popcorn and pretzels. The items listed above may not be a complete list of recommended foods or beverages. Contact your dietitian for more options. WHAT FOODS ARE NOT  RECOMMENDED? Grains White bread. White pasta. White rice. Refined cornbread. Bagels and croissants. Crackers that contain trans fat. Vegetables Creamed or fried vegetables. Vegetables in a cheese sauce. Regular canned vegetables. Regular canned tomato sauce  and paste. Regular tomato and vegetable juices. Fruits Dried fruits. Canned fruit in light or heavy syrup. Fruit juice. Meat and Other Protein Products Fatty cuts of meat. Ribs, chicken wings, bacon, sausage, bologna, salami, chitterlings, fatback, hot dogs, bratwurst, and packaged luncheon meats. Salted nuts and seeds. Canned beans with salt. Dairy Whole or 2% milk, cream, half-and-half, and cream cheese. Whole-fat or sweetened yogurt. Full-fat cheeses or blue cheese. Nondairy creamers and whipped toppings. Processed cheese, cheese spreads, or cheese curds. Condiments Onion and garlic salt, seasoned salt, table salt, and sea salt. Canned and packaged gravies. Worcestershire sauce. Tartar sauce. Barbecue sauce. Teriyaki sauce. Soy sauce, including reduced sodium. Steak sauce. Fish sauce. Oyster sauce. Cocktail sauce. Horseradish. Ketchup and mustard. Meat flavorings and tenderizers. Bouillon cubes. Hot sauce. Tabasco sauce. Marinades. Taco seasonings. Relishes. Fats and Oils Butter, stick margarine, lard, shortening, ghee, and bacon fat. Coconut, palm kernel, or palm oils. Regular salad dressings. Other Pickles and olives. Salted popcorn and pretzels. The items listed above may not be a complete list of foods and beverages to avoid. Contact your dietitian for more information. WHERE CAN I FIND MORE INFORMATION? National Heart, Lung, and Blood Institute: CablePromo.itwww.nhlbi.nih.gov/health/health-topics/topics/dash/ Document Released: 12/05/2011 Document Revised: 05/02/2014 Document Reviewed: 10/20/2013 [facility] Patient Information 2015 [place] [place]. This information is not intended to replace advice given to you by your health care provider. Make sure you discuss any questions you have with your health care provider.

## 2016-11-25 NOTE — Progress Notes (Signed)
Assessment and Plan:  1. Essential hypertension - continue medications, DASH diet, exercise and monitor at home. Call if greater than 130/80.  - CBC with Differential/Platelet - BASIC METABOLIC PANEL WITH GFR - Hepatic function panel - TSH  2. Prediabetes Discussed general issues about diabetes pathophysiology and management., Educational material distributed., Suggested low cholesterol diet., Encouraged aerobic exercise., Discussed foot care., Reminded to get yearly retinal exam. - Hemoglobin A1c  3. Vitamin D deficiency - VITAMIN D 25 Hydroxy (Vit-D Deficiency, Fractures)  4. Medication management - Magnesium  5. Morbid obesity (HCC) - long discussion about weight loss, diet, and exercise  6. Hyperlipidemia, unspecified hyperlipidemia type -continue medications, check lipids, decrease fatty foods, increase activity.  - Lipid panel   Continue diet and meds as discussed. Further disposition pending results of labs. Future Appointments Date Time Provider Department Center  11/25/2016 4:00 PM Quentin MullingAmanda Vonnie Spagnolo, PA-C [facility] None  09/04/2017 2:00 PM Quentin MullingAmanda Alisen Marsiglia, PA-C [facility] None    HPI 60 y.o. AA female  presents for 6 month follow up with hypertension, hyperlipidemia, prediabetes and vitamin D. 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  Lab Results  Component Value Date   GFRAA 77 08/22/2016   She does not workout.  She denies chest pain, shortness of breath, dizziness.  She is not on cholesterol medication and denies myalgias. Her cholesterol is at goal. The cholesterol last visit was:   Lab Results  Component Value Date   CHOL 140 08/22/2016   HDL 54 08/22/2016   LDLCALC 72 08/22/2016   TRIG 68 08/22/2016   CHOLHDL 2.6 08/22/2016   She has been working on diet and exercise for prediabetes, and denies paresthesia of the feet, polydipsia, polyuria and visual disturbances. Last A1C in the office was:  Lab Results   Component Value Date   HGBA1C 5.5 08/22/2016   Patient is on Vitamin D supplement, she is on 50,000 once a week. Has not had any kidney stones.  Lab Results  Component Value Date   VD25OH 33 08/22/2016     Has cervical DDD with radiation down her right side,has had ESI to cervical neck which helped. She has bilateral shoulder indentations and occ yeast under her breast during the summer. She has to wear two bras for support and has been unable to workout due to pain from her neck. She is getting breast reduction surgery tomorrow and is very excited about this.  BMI is Body mass index is 35.49 kg/m., she is working on diet and exercise. Wt Readings from Last 3 Encounters:  11/25/16 230 lb (104.3 kg)  08/22/16 232 lb 9.6 oz (105.5 kg)  04/10/16 226 lb 3.2 oz (102.6 kg)     Current Medications:  Current Outpatient Prescriptions on File Prior to Visit  Medication Sig Dispense Refill  . atenolol (TENORMIN) 50 MG tablet TAKE 1 TABLET BY MOUTH EVERY DAY 90 tablet 0  . buPROPion (WELLBUTRIN XL) 150 MG 24 hr tablet Take 2 tablets (300 mg total) by mouth every morning. 180 tablet 3  . hydrochlorothiazide (HYDRODIURIL) 25 MG tablet TAKE 1 TABLET (25 MG TOTAL) BY MOUTH DAILY. 90 tablet 0  . Vitamin D, Ergocalciferol, (DRISDOL) 50000 units CAPS capsule TAKE 1 CAPSULE (50,000 UNITS TOTAL) BY MOUTH DAILY. 30 capsule 0   No current facility-administered medications on file prior to visit.    Medical History:  Past Medical History:  Diagnosis Date  . Depression   . Hypertension   .  Kidney stones 11-2011  . Obesity   . Shingles    chest-right  . Vitamin D deficiency    Allergies: No Known Allergies   Review of Systems:  Review of Systems  Constitutional: Negative for malaise/fatigue.  HENT: Negative for congestion, ear pain, hearing loss, sore throat and tinnitus.   Eyes: Negative.  Negative for blurred vision.  Respiratory: Negative.   Cardiovascular: Negative.   Gastrointestinal:  Negative.   Genitourinary: Negative.   Musculoskeletal: Negative.   Skin: Negative.   Neurological: Negative for dizziness and headaches.  Endo/Heme/Allergies: Negative.   Psychiatric/Behavioral: Negative.     Family history- Review and unchanged Social history- Review and unchanged Physical Exam: BP (!) 144/88   Pulse 68   Temp 98.2 F (36.8 C) (Temporal)   Resp 18   Ht 5' 7.5" (1.715 m)   Wt 230 lb (104.3 kg)   BMI 35.49 kg/m  Wt Readings from Last 3 Encounters:  11/25/16 230 lb (104.3 kg)  08/22/16 232 lb 9.6 oz (105.5 kg)  04/10/16 226 lb 3.2 oz (102.6 kg)   General Appearance: Well nourished, in no apparent distress. Eyes: PERRLA, EOMs, conjunctiva no swelling or erythema Sinuses: No Frontal/maxillary tenderness ENT/Mouth: Ext aud canals clear, TMs without erythema, bulging. No erythema, swelling, or exudate on post pharynx.  Tonsils not swollen or erythematous. Hearing normal.  Neck: Supple, thyroid normal.  Respiratory: Respiratory effort normal, BS equal bilaterally without rales, rhonchi, wheezing or stridor.  Cardio: RRR with no MRGs. Brisk peripheral pulses without edema.  Abdomen: Soft, + BS, obese  Non tender, no guarding, rebound, hernias, masses. Lymphatics: Non tender without lymphadenopathy.  Musculoskeletal: Full ROM, 5/5 strength, normal gait.  Skin: Warm, dry without rashes, lesions, ecchymosis.  Neuro: Cranial nerves intact. Normal muscle tone, no cerebellar symptoms. Sensation intact.  Psych: Awake and oriented X 3, normal affect, Insight and Judgment appropriate.    Quentin MullingAmanda Serrina Minogue, PA-C 3:58 PM [facility]

## 2016-11-26 ENCOUNTER — Other Ambulatory Visit: Payer: Self-pay | Admitting: Plastic Surgery

## 2017-01-19 ENCOUNTER — Other Ambulatory Visit: Payer: Self-pay | Admitting: Internal Medicine

## 2017-01-19 ENCOUNTER — Other Ambulatory Visit: Payer: Self-pay | Admitting: Physician Assistant

## 2017-03-03 ENCOUNTER — Encounter: Payer: Self-pay | Admitting: Physician Assistant

## 2017-03-03 ENCOUNTER — Ambulatory Visit (INDEPENDENT_AMBULATORY_CARE_PROVIDER_SITE_OTHER): Payer: BLUE CROSS/BLUE SHIELD | Admitting: Physician Assistant

## 2017-03-03 VITALS — BP 132/76 | HR 67 | Temp 97.7°F | Resp 16 | Ht 67.5 in | Wt 226.2 lb

## 2017-03-03 DIAGNOSIS — H9202 Otalgia, left ear: Secondary | ICD-10-CM | POA: Diagnosis not present

## 2017-03-03 DIAGNOSIS — F325 Major depressive disorder, single episode, in full remission: Secondary | ICD-10-CM

## 2017-03-03 DIAGNOSIS — M5030 Other cervical disc degeneration, unspecified cervical region: Secondary | ICD-10-CM | POA: Diagnosis not present

## 2017-03-03 DIAGNOSIS — I10 Essential (primary) hypertension: Secondary | ICD-10-CM | POA: Diagnosis not present

## 2017-03-03 DIAGNOSIS — N62 Hypertrophy of breast: Secondary | ICD-10-CM | POA: Diagnosis not present

## 2017-03-03 DIAGNOSIS — E559 Vitamin D deficiency, unspecified: Secondary | ICD-10-CM

## 2017-03-03 DIAGNOSIS — R7303 Prediabetes: Secondary | ICD-10-CM | POA: Diagnosis not present

## 2017-03-03 DIAGNOSIS — Z87442 Personal history of urinary calculi: Secondary | ICD-10-CM

## 2017-03-03 DIAGNOSIS — Z79899 Other long term (current) drug therapy: Secondary | ICD-10-CM

## 2017-03-03 LAB — HEPATIC FUNCTION PANEL
ALBUMIN: 4.1 g/dL (ref 3.6–5.1)
ALT: 17 U/L (ref 6–29)
AST: 20 U/L (ref 10–35)
Alkaline Phosphatase: 82 U/L (ref 33–130)
BILIRUBIN INDIRECT: 0.4 mg/dL (ref 0.2–1.2)
Bilirubin, Direct: 0.2 mg/dL (ref <=0.2)
TOTAL PROTEIN: 7.1 g/dL (ref 6.1–8.1)
Total Bilirubin: 0.6 mg/dL (ref 0.2–1.2)

## 2017-03-03 LAB — CBC WITH DIFFERENTIAL/PLATELET
BASOS ABS: 0 {cells}/uL (ref 0–200)
Basophils Relative: 0 %
Eosinophils Absolute: 198 cells/uL (ref 15–500)
Eosinophils Relative: 2 %
HEMATOCRIT: 39.2 % (ref 35.0–45.0)
Hemoglobin: 13 g/dL (ref 11.7–15.5)
LYMPHS PCT: 37 %
Lymphs Abs: 3663 cells/uL (ref 850–3900)
MCH: 27.3 pg (ref 27.0–33.0)
MCHC: 33.2 g/dL (ref 32.0–36.0)
MCV: 82.2 fL (ref 80.0–100.0)
MONO ABS: 594 {cells}/uL (ref 200–950)
MPV: 9.7 fL (ref 7.5–12.5)
Monocytes Relative: 6 %
NEUTROS PCT: 55 %
Neutro Abs: 5445 cells/uL (ref 1500–7800)
Platelets: 304 10*3/uL (ref 140–400)
RBC: 4.77 MIL/uL (ref 3.80–5.10)
RDW: 14.2 % (ref 11.0–15.0)
WBC: 9.9 10*3/uL (ref 3.8–10.8)

## 2017-03-03 LAB — HEMOGLOBIN A1C
Hgb A1c MFr Bld: 5.5 % (ref <5.7)
Mean Plasma Glucose: 111 mg/dL

## 2017-03-03 LAB — BASIC METABOLIC PANEL WITH GFR
BUN: 14 mg/dL (ref 7–25)
CALCIUM: 9.7 mg/dL (ref 8.6–10.4)
CO2: 29 mmol/L (ref 20–31)
CREATININE: 0.93 mg/dL (ref 0.50–0.99)
Chloride: 99 mmol/L (ref 98–110)
GFR, EST AFRICAN AMERICAN: 77 mL/min (ref >=60)
GFR, EST NON AFRICAN AMERICAN: 67 mL/min (ref >=60)
GLUCOSE: 75 mg/dL (ref 65–99)
Potassium: 3.4 mmol/L — ABNORMAL LOW (ref 3.5–5.3)
Sodium: 139 mmol/L (ref 135–146)

## 2017-03-03 LAB — LIPID PANEL
Cholesterol: 149 mg/dL (ref <200)
HDL: 55 mg/dL (ref >50)
LDL CALC: 84 mg/dL (ref <100)
TRIGLYCERIDES: 49 mg/dL (ref <150)
Total CHOL/HDL Ratio: 2.7 Ratio (ref <5.0)
VLDL: 10 mg/dL (ref <30)

## 2017-03-03 LAB — MAGNESIUM: Magnesium: 2.2 mg/dL (ref 1.5–2.5)

## 2017-03-03 LAB — TSH: TSH: 1.52 m[IU]/L

## 2017-03-03 MED ORDER — SULFAMETHOXAZOLE-TRIMETHOPRIM 800-160 MG PO TABS: 1.0000 | ORAL_TABLET | Freq: Two times a day (BID) | ORAL | 0 refills | Status: DC

## 2017-03-03 MED ORDER — PHENTERMINE HCL 37.5 MG PO TABS: 37.5000 mg | ORAL_TABLET | Freq: Every day | ORAL | 2 refills | Status: DC

## 2017-03-03 NOTE — Progress Notes (Signed)
Assessment and Plan:  Essential hypertension - continue medications, DASH diet, exercise and monitor at home. Call if greater than 130/80.  - CBC with Differential/Platelet - BASIC METABOLIC PANEL WITH GFR - Hepatic function panel - TSH  Prediabetes Discussed general issues about diabetes pathophysiology and management., Educational material distributed., Suggested low cholesterol diet., Encouraged aerobic exercise., Discussed foot care., Reminded to get yearly retinal exam. - Hemoglobin A1c  Vitamin D deficiency - VITAMIN D 25 Hydroxy (Vit-D Deficiency, Fractures)  Medication management - Magnesium  Morbid obesity (HCC) - long discussion about weight loss, diet, and exercise Will get on phentermine,follow up 1 month   Hyperlipidemia, unspecified hyperlipidemia type -continue medications, check lipids, decrease fatty foods, increase activity.  - Lipid panel  Left ear pain ? Cyst/infection, bactrim, warm compresses  Continue diet and meds as discussed. Further disposition pending results of labs. Future Appointments Date Time Provider Department Center  09/04/2017 2:00 PM Quentin Mulling, PA-C [facility] None    HPI 61 y.o. AA female  presents for 6 month follow up with hypertension, hyperlipidemia, prediabetes and vitamin D. Her blood pressure has not been controlled at home,BP: 132/76She has CKD due to HTN.  Lab Results  Component Value Date   GFRAA 77 08/22/2016   She does not workout.  She denies chest pain, shortness of breath, dizziness.  She is not on cholesterol medication and denies myalgias. Her cholesterol is at goal. The cholesterol last visit was:   Lab Results  Component Value Date   CHOL 140 08/22/2016   HDL 54 08/22/2016   LDLCALC 72 08/22/2016   TRIG 68 08/22/2016   CHOLHDL 2.6 08/22/2016   She has been working on diet and exercise for prediabetes, and denies paresthesia of the feet, polydipsia, polyuria and visual disturbances. Last A1C in the office  was:  Lab Results  Component Value Date   HGBA1C 5.5 08/22/2016   Patient is on Vitamin D supplement, she is on 50,000 once a week. Has not had any kidney stones.  Lab Results  Component Value Date   VD25OH 33 08/22/2016     She is s/p breast reduction with Dr. Odis Luster. She is doing well, will do sports bra x 3 months, and she is massaging her breast to decrease scarring, she is now in a double D. States has helped her neck/shoulder pain.  Has had left ear pain on and off x 1 month.  BMI is Body mass index is 34.91 kg/m., she is working on diet and exercise. Wt Readings from Last 3 Encounters:  03/03/17 226 lb 3.2 oz (102.6 kg)  11/25/16 230 lb (104.3 kg)  08/22/16 232 lb 9.6 oz (105.5 kg)     Current Medications:  Current Outpatient Prescriptions on File Prior to Visit  Medication Sig Dispense Refill  . atenolol (TENORMIN) 50 MG tablet TAKE 1 TABLET BY MOUTH EVERY DAY 90 tablet 0  . buPROPion (WELLBUTRIN XL) 150 MG 24 hr tablet Take 2 tablets (300 mg total) by mouth every morning. 180 tablet 3  . hydrochlorothiazide (HYDRODIURIL) 25 MG tablet TAKE 1 TABLET (25 MG TOTAL) BY MOUTH DAILY. 90 tablet 0  . Vitamin D, Ergocalciferol, (DRISDOL) 50000 units CAPS capsule TAKE 1 CAPSULE (50,000 UNITS TOTAL) BY MOUTH DAILY. 30 capsule 0   No current facility-administered medications on file prior to visit.    Medical History:  Past Medical History:  Diagnosis Date  . Depression   . Hypertension   . Kidney stones 11-2011  . Obesity   .  Shingles    chest-right  . Vitamin D deficiency    Allergies: No Known Allergies   Review of Systems:  Review of Systems  Constitutional: Negative for malaise/fatigue.  HENT: Negative for congestion, ear pain, hearing loss, sore throat and tinnitus.   Eyes: Negative.  Negative for blurred vision.  Respiratory: Negative.   Cardiovascular: Negative.   Gastrointestinal: Negative.   Genitourinary: Negative.   Musculoskeletal: Negative.   Skin:  Negative.   Neurological: Negative for dizziness and headaches.  Endo/Heme/Allergies: Negative.   Psychiatric/Behavioral: Negative.     Family history- Review and unchanged Social history- Review and unchanged Physical Exam: BP 132/76   Pulse 67   Temp 97.7 F (36.5 C)   Resp 16   Ht 5' 7.5" (1.715 m)   Wt 226 lb 3.2 oz (102.6 kg)   SpO2 98%   BMI 34.91 kg/m  Wt Readings from Last 3 Encounters:  03/03/17 226 lb 3.2 oz (102.6 kg)  11/25/16 230 lb (104.3 kg)  08/22/16 232 lb 9.6 oz (105.5 kg)   General Appearance: Well nourished, in no apparent distress. Eyes: PERRLA, EOMs, conjunctiva no swelling or erythema Sinuses: No Frontal/maxillary tenderness ENT/Mouth: Ext aud canals clear, TMs without erythema, bulging. No erythema, swelling, or exudate on post pharynx.  Tonsils not swollen or erythematous. Hearing normal.  Neck: Supple, thyroid normal.  Respiratory: Respiratory effort normal, BS equal bilaterally without rales, rhonchi, wheezing or stridor.  Cardio: RRR with no MRGs. Brisk peripheral pulses without edema.  Abdomen: Soft, + BS, obese  Non tender, no guarding, rebound, hernias, masses. Lymphatics: Non tender without lymphadenopathy.  Musculoskeletal: Full ROM, 5/5 strength, normal gait.  Skin: Warm, dry without rashes, lesions, ecchymosis.  Neuro: Cranial nerves intact. Normal muscle tone, no cerebellar symptoms. Sensation intact.  Psych: Awake and oriented X 3, normal affect, Insight and Judgment appropriate.    Quentin MullingAmanda Collier, PA-C 10:55 AM [facility]

## 2017-03-03 NOTE — Patient Instructions (Addendum)
At least 80oz a day of water, up to 100oz for weight loss.  Need at least 3 meals a day Eat breakfast- try yogurt, boiled eggs   Simple math prevails.    1st - exercise does not produce significant weight loss - at best one converts fat into muscle , "bulks up", loses inches, but usually stays "weight neutral"     2nd - think of your body weightas a check book: If you eat more calories than you burn up - you save money or gain weight .... Or if you spend more money than you put in the check book, ie burn up more calories than you eat, then you lose weight     3rd - if you walk or run 1 mile, you burn up 100 calories - you have to burn up 3,500 calories to lose 1 pound, ie you have to walk/run 35 miles to lose 1 measly pound. So if you want to lose 10 #, then you have to walk/run 350 miles, so.... clearly exercise is not the solution.     4. So if you consume 1,500 calories, then you have to burn up the equivalent of 15 miles to stay weight neutral - It also stands to reason that if you consume 1,500 cal/day and don't lose weight, then you must be burning up about 1,500 cals/day to stay weight neutral.     5. If you really want to lose weight, you must cut your calorie intake 300 calories /day and at that rate you should lose about 1 # every 3 days.   6. Please purchase Dr Francis Dowse Fuhrman's book(s) "The End of Dieting" & "Eat to Live" . It has some great concepts and recipes.       Phentermine  While taking the medication we may ask that you come into the office once a month or once every 2-3 months to monitor your weight, blood pressure, and heart rate. In addition we can help answer your questions about diet, exercise, and help you every step of the way with your weight loss journey. Sometime it is helpful if you bring in a food diary or use an app on your phone such as myfitnesspal to record your calorie intake, especially in the beginning.   You can start out on 1/3 to 1/2 a pill in the  morning and if you are tolerating it well you can increase to one pill daily. I also have some patients that take 1/3 or 1/2 at lunch to help prevent night time eating.  This medication is cheapest CASH pay at [facility] [employer] [facility] is 14-17 dollars and you do NOT need a membership to get meds from there.    What is this medicine? PHENTERMINE (FEN ter meen) decreases your appetite. This medicine is intended to be used in addition to a healthy reduced calorie diet and exercise. The best results are achieved this way. This medicine is only indicated for short-term use. Eventually your weight loss may level out and the medication will no longer be needed.   How should I use this medicine? Take this medicine by mouth. Follow the directions on the prescription label. The tablets should stay in the bottle until immediately before you take your dose. Take your doses at regular intervals. Do not take your medicine more often than directed.  Overdosage: If you think you have taken too much of this medicine contact a poison control center or emergency room at once. NOTE: This  medicine is only for you. Do not share this medicine with others.  What if I miss a dose? If you miss a dose, take it as soon as you can. If it is almost time for your next dose, take only that dose. Do not take double or extra doses. Do not increase or in any way change your dose without consulting your doctor.  What should I watch for while using this medicine? Notify your physician immediately if you become short of breath while doing your normal activities. Do not take this medicine within 6 hours of bedtime. It can keep you from getting to sleep. Avoid drinks that contain caffeine and try to stick to a regular bedtime every night. Do not stand or sit up quickly, especially if you are an older patient. This reduces the risk of dizzy or fainting spells. Avoid alcoholic drinks.  What side effects may I notice from  receiving this medicine? Side effects that you should report to your doctor or health care professional as soon as possible: -chest pain, palpitations -depression or severe changes in mood -increased blood pressure -irritability -nervousness or restlessness -severe dizziness -shortness of breath -problems urinating -unusual swelling of the legs -vomiting  Side effects that usually do not require medical attention (report to your doctor or health care professional if they continue or are bothersome): -blurred vision or other eye problems -changes in sexual ability or desire -constipation or diarrhea -difficulty sleeping -dry mouth or unpleasant taste -headache -nausea This list may not describe all possible side effects. Call your doctor for medical advice about side effects. You may report side effects to FDA at 1-800-FDA-1088.

## 2017-03-04 LAB — VITAMIN D 25 HYDROXY (VIT D DEFICIENCY, FRACTURES): VIT D 25 HYDROXY: 42 ng/mL (ref 30–100)

## 2017-03-04 NOTE — Progress Notes (Signed)
Pt aware of lab results & voiced understanding of those results.

## 2017-03-20 ENCOUNTER — Telehealth: Payer: Self-pay

## 2017-03-20 ENCOUNTER — Other Ambulatory Visit: Payer: Self-pay | Admitting: Physician Assistant

## 2017-03-20 MED ORDER — DOXYCYCLINE HYCLATE 100 MG PO CAPS: ORAL_CAPSULE | ORAL | 0 refills | Status: DC

## 2017-03-20 MED ORDER — PREDNISONE 20 MG PO TABS: ORAL_TABLET | ORAL | 0 refills | Status: DC

## 2017-03-20 NOTE — Telephone Encounter (Signed)
Pt reports ear still not any better would like an ABX.  Pt was informed that an ABX & prednisone was sent to pharmacy.  Pt agreed & hung up

## 2017-04-08 ENCOUNTER — Ambulatory Visit (INDEPENDENT_AMBULATORY_CARE_PROVIDER_SITE_OTHER): Payer: BLUE CROSS/BLUE SHIELD | Admitting: Physician Assistant

## 2017-04-08 ENCOUNTER — Encounter: Payer: Self-pay | Admitting: Physician Assistant

## 2017-04-08 VITALS — BP 132/80 | HR 67 | Temp 97.5°F | Resp 16 | Ht 67.5 in | Wt 226.6 lb

## 2017-04-08 DIAGNOSIS — H9202 Otalgia, left ear: Secondary | ICD-10-CM

## 2017-04-08 DIAGNOSIS — I10 Essential (primary) hypertension: Secondary | ICD-10-CM

## 2017-04-08 MED ORDER — HYDROCORTISONE-ACETIC ACID 1-2 % OT SOLN: 3.0000 [drp] | Freq: Two times a day (BID) | OTIC | 1 refills | Status: DC

## 2017-04-08 MED ORDER — CIPROFLOXACIN-HYDROCORTISONE 0.2-1 % OT SUSP: 3.0000 [drp] | Freq: Two times a day (BID) | OTIC | 0 refills | Status: DC

## 2017-04-08 MED ORDER — OFLOXACIN 0.3 % OT SOLN: 10.0000 [drp] | Freq: Every day | OTIC | 0 refills | Status: DC

## 2017-04-08 NOTE — Patient Instructions (Addendum)
Follow up/call the office 4-6 weeks after you start on the phentermine     Otitis Externa Otitis externa is an infection of the outer ear canal. The outer ear canal is the area between the outside of the ear and the eardrum. Otitis externa is sometimes called "swimmer's ear." What are the causes? This condition may be caused by:  Swimming in dirty water.  Moisture in the ear.  An injury to the inside of the ear.  An object stuck in the ear.  A cut or scrape on the outside of the ear. What increases the risk? This condition is more likely to develop in swimmers. What are the signs or symptoms? The first symptom of this condition is often itching in the ear. Later signs and symptoms include:  Swelling of the ear.  Redness in the ear.  Ear pain. The pain may get worse when you pull on your ear.  Pus coming from the ear. How is this diagnosed? This condition may be diagnosed by examining the ear and testing fluid from the ear for bacteria and funguses. How is this treated? This condition may be treated with:  Antibiotic ear drops. These are often given for 10-14 days.  Medicine to reduce itching and swelling. Follow these instructions at home:  If you were prescribed antibiotic ear drops, apply them as told by your health care provider. Do not stop using the antibiotic even if your condition improves.  Take over-the-counter and prescription medicines only as told by your health care provider.  Keep all follow-up visits as told by your health care provider. This is important. How is this prevented?  Keep your ear dry. Use the corner of a towel to dry your ear after you swim or bathe.  Avoid scratching or putting things in your ear. Doing these things can damage the ear canal or remove the protective wax that lines it, which makes it easier for bacteria and funguses to grow.  Avoid swimming in lakes, polluted water, or pools that may not have the right amount of  chlorine.  Consider making ear drops and putting 3 or 4 drops in each ear after you swim. Ask your health care provider about how you can make ear drops. Contact a health care provider if:  You have a fever.  After 3 days your ear is still red, swollen, painful, or draining pus.  Your redness, swelling, or pain gets worse.  You have a severe headache.  You have redness, swelling, pain, or tenderness in the area behind your ear. This information is not intended to replace advice given to you by your health care provider. Make sure you discuss any questions you have with your health care provider. Document Released: 12/16/2005 Document Revised: 01/23/2016 Document Reviewed: 09/25/2015 Elsevier Interactive Patient Education  2017 [employer].

## 2017-04-08 NOTE — Progress Notes (Signed)
60 y.o.female presents for a follow up after being on phentermine for weight loss, she however took it while taking bactrim/doxycycline and prednisone for abnormal left ear pain/cyst and had to stop phentermine due to AE's, nausea, HA, flushing, sweating, resolved. Has not been on it, has not lost any weight.  She states her left ear is improved but no 100% better.   BMI is Body mass index is 34.97 kg/m., she is working on diet and exercise. Wt Readings from Last 3 Encounters:  04/08/17 226 lb 9.6 oz (102.8 kg)  03/03/17 226 lb 3.2 oz (102.6 kg)  11/25/16 230 lb (104.3 kg)   Medications: Current Outpatient Prescriptions on File Prior to Visit  Medication Sig Dispense Refill  . atenolol (TENORMIN) 50 MG tablet TAKE 1 TABLET BY MOUTH EVERY DAY 90 tablet 0  . buPROPion (WELLBUTRIN XL) 150 MG 24 hr tablet Take 2 tablets (300 mg total) by mouth every morning. 180 tablet 3  . hydrochlorothiazide (HYDRODIURIL) 25 MG tablet TAKE 1 TABLET (25 MG TOTAL) BY MOUTH DAILY. 90 tablet 0  . phentermine (ADIPEX-P) 37.5 MG tablet Take 1 tablet (37.5 mg total) by mouth daily before breakfast. 30 tablet 2  . Vitamin D, Ergocalciferol, (DRISDOL) 50000 units CAPS capsule TAKE 1 CAPSULE (50,000 UNITS TOTAL) BY MOUTH DAILY. 30 capsule 0   No current facility-administered medications on file prior to visit.     ROS: All negative except for above  Physical exam: Vitals:   04/08/17 1627  BP: 132/80  Pulse: 67  Resp: 16  Temp: 97.5 F (36.4 C)   Physical Exam  Constitutional: She is oriented to person, place, and time. She appears well-developed and well-nourished.  HENT:  Head: Normocephalic and atraumatic.  Right Ear: Hearing, tympanic membrane, external ear and ear canal normal. No drainage, swelling or tenderness. No mastoid tenderness. Tympanic membrane is not injected, not scarred, not perforated, not erythematous and not retracted.  Left Ear: Hearing and tympanic membrane normal. There is swelling and  tenderness. No drainage. No mastoid tenderness. Tympanic membrane is not injected, not scarred, not perforated, not erythematous and not retracted.  Mouth/Throat: Oropharynx is clear and moist.  Eyes: Conjunctivae and EOM are normal. Pupils are equal, round, and reactive to light.  Neck: Normal range of motion. Neck supple. No thyromegaly present.  Cardiovascular: Normal rate, regular rhythm and normal heart sounds.  Exam reveals no gallop and no friction rub.   No murmur heard. Pulmonary/Chest: Effort normal and breath sounds normal. No respiratory distress. She has no wheezes.  Abdominal: Soft. Bowel sounds are normal. She exhibits no distension and no mass. There is no tenderness. There is no rebound and no guarding.  Musculoskeletal: Normal range of motion.  Lymphadenopathy:    She has no cervical adenopathy.  Neurological: She is alert and oriented to person, place, and time. She displays normal reflexes. No cranial nerve deficit. Coordination normal.  Skin: Skin is warm and dry.  Psychiatric: She has a normal mood and affect.    Assessment and Plan: Morbid Obesity with co morbidities - long discussion about weight loss, diet, and exercise  Left ear pain Swelling in internal ear canal compared to right, no lymphadenopathy, will do drops, heating pad, and refer to ENT  Future Appointments Date Time Provider Department Center  09/04/2017 2:00 PM Quentin Mulling, PA-C [facility] None

## 2017-04-27 ENCOUNTER — Other Ambulatory Visit: Payer: Self-pay | Admitting: Internal Medicine

## 2017-05-08 ENCOUNTER — Ambulatory Visit: Payer: Self-pay | Admitting: Physician Assistant

## 2017-09-04 ENCOUNTER — Ambulatory Visit (INDEPENDENT_AMBULATORY_CARE_PROVIDER_SITE_OTHER): Payer: BLUE CROSS/BLUE SHIELD | Admitting: Physician Assistant

## 2017-09-04 ENCOUNTER — Encounter: Payer: Self-pay | Admitting: Physician Assistant

## 2017-09-04 VITALS — BP 140/90 | HR 68 | Temp 97.5°F | Resp 16 | Ht 69.0 in | Wt 226.8 lb

## 2017-09-04 DIAGNOSIS — R5383 Other fatigue: Secondary | ICD-10-CM

## 2017-09-04 DIAGNOSIS — E559 Vitamin D deficiency, unspecified: Secondary | ICD-10-CM | POA: Diagnosis not present

## 2017-09-04 DIAGNOSIS — R7303 Prediabetes: Secondary | ICD-10-CM

## 2017-09-04 DIAGNOSIS — Z130 Encounter for screening for diseases of the blood and blood-forming organs and certain disorders involving the immune mechanism: Secondary | ICD-10-CM

## 2017-09-04 DIAGNOSIS — M5030 Other cervical disc degeneration, unspecified cervical region: Secondary | ICD-10-CM

## 2017-09-04 DIAGNOSIS — N62 Hypertrophy of breast: Secondary | ICD-10-CM

## 2017-09-04 DIAGNOSIS — F325 Major depressive disorder, single episode, in full remission: Secondary | ICD-10-CM

## 2017-09-04 DIAGNOSIS — Z13220 Encounter for screening for lipoid disorders: Secondary | ICD-10-CM

## 2017-09-04 DIAGNOSIS — I10 Essential (primary) hypertension: Secondary | ICD-10-CM

## 2017-09-04 DIAGNOSIS — Z79899 Other long term (current) drug therapy: Secondary | ICD-10-CM | POA: Diagnosis not present

## 2017-09-04 DIAGNOSIS — Z136 Encounter for screening for cardiovascular disorders: Secondary | ICD-10-CM

## 2017-09-04 DIAGNOSIS — Z0001 Encounter for general adult medical examination with abnormal findings: Secondary | ICD-10-CM

## 2017-09-04 DIAGNOSIS — Z87442 Personal history of urinary calculi: Secondary | ICD-10-CM

## 2017-09-04 DIAGNOSIS — Z0000 Encounter for general adult medical examination without abnormal findings: Secondary | ICD-10-CM

## 2017-09-04 MED ORDER — PHENTERMINE HCL 37.5 MG PO TABS: 37.5000 mg | ORAL_TABLET | Freq: Every day | ORAL | 2 refills | Status: DC

## 2017-09-04 NOTE — Progress Notes (Addendum)
Complete Physical  Assessment and Plan: Essential hypertension --DASH diet, exercise and monitor at home. Call if greater than 130/80.  - EKG   Depression Remission, continue meds  Vitamin D deficiency Check level Continue supplement, take more regularly  Routine general medical examination at a health care facility  Morbid Obesity, Class II, BMI 35-39.9, with comorbidity  long discussion about weight loss, diet, and exercise -     phentermine (ADIPEX-P) 37.5 MG tablet; Take 1 tablet (37.5 mg total) by mouth daily before breakfast. - bring back 3 months  Other abnormal glucose Discussed general issues about diabetes pathophysiology and management., Educational material distributed., Suggested low cholesterol diet., Encouraged aerobic exercise., Discussed foot care., Reminded to get yearly retinal exam.  Kidney stone history Continue follow up, increase fluids  Screening, anemia, deficiency, iron -     Vitamin B12 -     Iron,Total/Total Iron Binding Cap   Discussed med's effects and SE's. Screening labs and tests as requested with regular follow-up as recommended.  HPI 61 y.o. female  presents for a complete physical.  Her blood pressure has not been controlled at home, today their BP is BP: 140/90. She is on atenolol  daily but only takes HCTZ PRN.  She does workout, she walks. She denies chest pain, shortness of breath, dizziness She is not on cholesterol medication and denies myalgias. Her cholesterol is at goal. The cholesterol last visit was:   Lab Results  Component Value Date   CHOL 149 03/03/2017   HDL 55 03/03/2017   LDLCALC 84 03/03/2017   TRIG 49 03/03/2017   CHOLHDL 2.7 03/03/2017   She has preDM and elevated insulin due to obesity, denies DM polys:  Lab Results  Component Value Date   HGBA1C 5.5 03/03/2017   Patient is on Vitamin D supplement.   Lab Results  Component Value Date   VD25OH 42 03/03/2017     Has recurrent kidney stones, on HCTZ,  see's Dr. Benancio Deeds in [place].  She BMI is Body mass index is 33.49 kg/m., she is working on diet and exercise and has done well. She has started the phentermine back last week.  Wt Readings from Last 3 Encounters:  09/04/17 226 lb 12.8 oz (102.9 kg)  04/08/17 226 lb 9.6 oz (102.8 kg)  03/03/17 226 lb 3.2 oz (102.6 kg)    Current Medications:  Current Outpatient Prescriptions on File Prior to Visit  Medication Sig Dispense Refill  . atenolol (TENORMIN) 50 MG tablet TAKE 1 TABLET BY MOUTH EVERY DAY 90 tablet 1  . hydrochlorothiazide (HYDRODIURIL) 25 MG tablet TAKE 1 TABLET (25 MG TOTAL) BY MOUTH DAILY. 90 tablet 1  . phentermine (ADIPEX-P) 37.5 MG tablet Take 1 tablet (37.5 mg total) by mouth daily before breakfast. 30 tablet 2   No current facility-administered medications on file prior to visit.    Health Maintenance:   Immunization History  Administered Date(s) Administered  . Tdap 08/22/2016   Tetanus: 2017 Pneumovax: 2000 Prevnar 13: due age 42 Flu vaccine: gets at work Zostavax: Pap:2010 s/p hysterectomy MGM: 09/2016 DEXA: Colonoscopy: 09/03/2016 EGD: Pelvis US 2008 Ct AB pelvis 04/06/2014  Patient Care Team: Lucky Cowboy, MD as PCP - General (Internal Medicine) Dr. Benancio Deeds, urologist  Medical History:  Past Medical History:  Diagnosis Date  . Depression   . Hypertension   . Kidney stones 11-2011  . Obesity   . Shingles    chest-right  . Vitamin D deficiency    Allergies No Known Allergies  SURGICAL HISTORY She  has a past surgical history that includes Lithotripsy; Ear Cyst Excision (Left); Abdominal hysterectomy; and Breast reduction surgery. FAMILY HISTORY Her family history includes Cancer in her mother; Diabetes in her father; Hyperlipidemia in her father; Hypertension in her mother; Stroke in her father. SOCIAL HISTORY She  reports that she has never smoked. She has never used smokeless tobacco. She reports that she does not drink alcohol or use  drugs.  Review of Systems  Constitutional: Negative.   HENT: Negative for ear pain and hearing loss.   Eyes: Negative.   Respiratory: Negative.   Cardiovascular: Negative.  Negative for chest pain and palpitations.  Gastrointestinal: Negative.   Genitourinary: Negative.   Musculoskeletal: Negative for neck pain.  Skin: Negative for rash.  Neurological: Negative.   Endo/Heme/Allergies: Negative.   Psychiatric/Behavioral: Negative.    Physical Exam: Estimated body mass index is 33.49 kg/m as calculated from the following:   Height as of this encounter: 5\' 9"  (1.753 m).   Weight as of this encounter: 226 lb 12.8 oz (102.9 kg). BP 140/90   Pulse 68   Temp (!) 97.5 F (36.4 C)   Resp 16   Ht 5\' 9"  (1.753 m)   Wt 226 lb 12.8 oz (102.9 kg)   SpO2 98%   BMI 33.49 kg/m  General Appearance: Well nourished, in no apparent distress. Eyes: PERRLA, EOMs, conjunctiva no swelling or erythema, normal fundi and vessels. Sinuses: No Frontal/maxillary tenderness ENT/Mouth: Ext aud canals clear, normal light reflex with TMs without erythema, bulging.  Good dentition. No erythema, swelling, or exudate on post pharynx. Tonsils not swollen or erythematous. Hearing normal.  Neck: Supple, thyroid normal. No bruits Respiratory: Respiratory effort normal, BS equal bilaterally without rales, rhonchi, wheezing or stridor. Cardio: RRR without murmurs, rubs or gallops. Brisk peripheral pulses without edema.  Chest: symmetric, with normal excursions and percussion. Breasts: Large pendulous breast. Symmetric, without lumps, nipple discharge, retractions. Abdomen: Soft, +BS. Non tender, no guarding, rebound, hernias, masses, or organomegaly. .  Lymphatics: Non tender without lymphadenopathy.  Genitourinary: defer Musculoskeletal: Full ROM all peripheral extremities,5/5 strength, and normal gait. Skin: Warm, dry without rashes, lesions, ecchymosis.  Neuro: Cranial nerves intact, reflexes equal bilaterally.  Normal muscle tone, no cerebellar symptoms. Sensation intact.  Psych: Awake and oriented X 3, normal affect, Insight and Judgment appropriate.   EKG: WNL no changes. AORTA SCAN: defer   Quentin Mullingmanda Cory Kitt 2:06 PM

## 2017-09-04 NOTE — Patient Instructions (Addendum)
Drink 80-100 oz a day of water, measure it out Eat 3 meals a day, have to do breakfast, eat protein- hard boiled eggs, protein bar like [place] protein bar, greek yogurt like oikos triple zero, chobani 100, or light n fit greek  Benefiber is good for constipation/diarrhea/irritable bowel syndrome, it helps with weight loss and can help lower your bad cholesterol. Please do 1 TBSP in the morning in water, coffee, or tea. It can take up to a month before you can see a difference with your bowel movements. It is cheapest from [employer], [employer], [employer].   Can do stool softner and start moving  Encourage you to get the 3D Mammogram  The 3D Mammogram is much more specific and sensitive to pick up breast cancer. For women with fibrocystic breast or lumpy breast it can be hard to determine if it is cancer or not but the 3D mammogram is able to tell this difference which cuts back on unneeded additional tests or scary call backs.   - over 40% increase in detection of breast cancer - over 40% reduction in false positives.  - fewer call backs - reduced anxiety - improved outcomes - [facility]   Phentermine  While taking the medication we may ask that you come into the office once a month or once every 2-3 months to monitor your weight, blood pressure, and heart rate. In addition we can help answer your questions about diet, exercise, and help you every step of the way with your weight loss journey. Sometime it is helpful if you bring in a food diary or use an app on your phone such as myfitnesspal to record your calorie intake, especially in the beginning.   You can start out on 1/3 to 1/2 a pill in the morning and if you are tolerating it well you can increase to one pill daily. I also have some patients that take 1/3 or 1/2 at lunch to help prevent night time eating.  This medication is [facility] [employer] OR [facility] is 14-17 dollars and you do NOT need a membership to get  meds from there.    What is this medicine? PHENTERMINE (FEN ter meen) decreases your appetite. This medicine is intended to be used in addition to a healthy reduced calorie diet and exercise. The best results are achieved this way. This medicine is only indicated for short-term use. Eventually your weight loss may level out and the medication will no longer be needed.   How should I use this medicine? Take this medicine by mouth. Follow the directions on the prescription label. The tablets should stay in the bottle until immediately before you take your dose. Take your doses at regular intervals. Do not take your medicine more often than directed.  Overdosage: If you think you have taken too much of this medicine contact a poison control center or emergency room at once. NOTE: This medicine is only for you. Do not share this medicine with others.  What if I miss a dose? If you miss a dose, take it as soon as you can. If it is almost time for your next dose, take only that dose. Do not take double or extra doses. Do not increase or in any way change your dose without consulting your doctor.  What should I watch for while using this medicine? Notify your physician immediately if you become short of breath while doing your normal activities. Do not take this  medicine within 6 hours of bedtime. It can keep you from getting to sleep. Avoid drinks that contain caffeine and try to stick to a regular bedtime every night. Do not stand or sit up quickly, especially if you are an older patient. This reduces the risk of dizzy or fainting spells. Avoid alcoholic drinks.  What side effects may I notice from receiving this medicine? Side effects that you should report to your doctor or health care professional as soon as possible: -chest pain, palpitations -depression or severe changes in mood -increased blood pressure -irritability -nervousness or restlessness -severe dizziness -shortness of  breath -problems urinating -unusual swelling of the legs -vomiting  Side effects that usually do not require medical attention (report to your doctor or health care professional if they continue or are bothersome): -blurred vision or other eye problems -changes in sexual ability or desire -constipation or diarrhea -difficulty sleeping -dry mouth or unpleasant taste -headache -nausea This list may not describe all possible side effects. Call your doctor for medical advice about side effects. You may report side effects to FDA at 1-800-FDA-1088.  We want weight loss that will last so you should lose 1-2 pounds a week.  THAT IS IT! Please pick THREE things a month to change. Once it is a habit check off the item. Then pick another three items off the list to become habits.  If you are already doing a habit on the list GREAT!  Cross that item off! o Don't drink your calories. Ie, alcohol, soda, fruit juice, and sweet tea.  o Drink more water. Drink a glass when you feel hungry or before each meal.  o Eat breakfast - Complex carb and protein (likeDannon light and fit yogurt, oatmeal, fruit, eggs, [place] bacon). o Measure your cereal.  Eat no more than one cup a day. (ie [place]) o Eat an apple a day. o Add a vegetable a day. o Try a new vegetable a month. o Use Pam! Stop using oil or butter to cook. o Don't finish your plate or use smaller plates. o Share your dessert. o Eat sugar free Jello for dessert or frozen grapes. o Don't eat 2-3 hours before bed. o Switch to whole wheat bread, pasta, and brown rice. o Make healthier choices when you eat out. No fries! o Pick baked chicken, NOT fried. o Don't forget to SLOW DOWN when you eat. It is not going anywhere.  o Take the stairs. o Park far away in the parking lot o [employer] (or weights) for 10 minutes while watching TV. o Walk at work for 10 minutes during break. o Walk outside 1 time a week with your friend, kids, dog, or  significant other. o Start a walking group at church. o Walk the mall as much as you can tolerate.  o Keep a food diary. o Weigh yourself daily. o Walk for 15 minutes 3 days per week. o Cook at home more often and eat out less.  If life happens and you go back to old habits, it is okay.  Just start over. You can do it!   If you experience chest pain, get short of breath, or tired during the exercise, please stop immediately and inform your doctor.

## 2017-09-05 ENCOUNTER — Other Ambulatory Visit: Payer: Self-pay | Admitting: Physician Assistant

## 2017-09-05 DIAGNOSIS — I10 Essential (primary) hypertension: Secondary | ICD-10-CM

## 2017-09-05 LAB — URINALYSIS W MICROSCOPIC + REFLEX CULTURE
Bacteria, UA: NONE SEEN /HPF
Bilirubin Urine: NEGATIVE
Glucose, UA: NEGATIVE
HYALINE CAST: NONE SEEN /LPF
Ketones, ur: NEGATIVE
Leukocyte Esterase: NEGATIVE
Nitrites, Initial: NEGATIVE
PH: 6.5 (ref 5.0–8.0)
PROTEIN: NEGATIVE
RBC / HPF: NONE SEEN /HPF (ref 0–2)
Specific Gravity, Urine: 1.008 (ref 1.001–1.03)
Squamous Epithelial / LPF: NONE SEEN /HPF (ref <=5)
WBC, UA: NONE SEEN /HPF (ref 0–5)

## 2017-09-05 LAB — BASIC METABOLIC PANEL WITH GFR
BUN: 13 mg/dL (ref 7–25)
CO2: 30 mmol/L (ref 20–32)
Calcium: 9.6 mg/dL (ref 8.6–10.4)
Chloride: 98 mmol/L (ref 98–110)
Creat: 0.93 mg/dL (ref 0.50–0.99)
GFR, Est African American: 77 mL/min/{1.73_m2} (ref >=60)
GFR, Est Non African American: 66 mL/min/{1.73_m2} (ref >=60)
GLUCOSE: 72 mg/dL (ref 65–99)
Potassium: 3.3 mmol/L — ABNORMAL LOW (ref 3.5–5.3)
Sodium: 138 mmol/L (ref 135–146)

## 2017-09-05 LAB — HEPATIC FUNCTION PANEL
AG Ratio: 1.4 (calc) (ref 1.0–2.5)
ALT: 11 U/L (ref 6–29)
AST: 15 U/L (ref 10–35)
Albumin: 4.1 g/dL (ref 3.6–5.1)
Alkaline phosphatase (APISO): 81 U/L (ref 33–130)
BILIRUBIN INDIRECT: 0.4 mg/dL (ref 0.2–1.2)
Bilirubin, Direct: 0.2 mg/dL (ref 0.0–0.2)
Globulin: 3 g/dL (calc) (ref 1.9–3.7)
TOTAL PROTEIN: 7.1 g/dL (ref 6.1–8.1)
Total Bilirubin: 0.6 mg/dL (ref 0.2–1.2)

## 2017-09-05 LAB — LIPID PANEL
CHOLESTEROL: 149 mg/dL (ref <200)
HDL: 56 mg/dL (ref >50)
LDL Cholesterol (Calc): 79 mg/dL (calc)
Non-HDL Cholesterol (Calc): 93 mg/dL (calc) (ref <130)
Total CHOL/HDL Ratio: 2.7 (calc) (ref <5.0)
Triglycerides: 62 mg/dL (ref <150)

## 2017-09-05 LAB — IRON, TOTAL/TOTAL IRON BINDING CAP
%SAT: 25 % (calc) (ref 11–50)
IRON: 83 ug/dL (ref 45–160)
TIBC: 335 mcg/dL (calc) (ref 250–450)

## 2017-09-05 LAB — CBC WITH DIFFERENTIAL/PLATELET
BASOS ABS: 42 {cells}/uL (ref 0–200)
BASOS PCT: 0.4 %
EOS ABS: 336 {cells}/uL (ref 15–500)
Eosinophils Relative: 3.2 %
HCT: 38.8 % (ref 35.0–45.0)
Hemoglobin: 12.9 g/dL (ref 11.7–15.5)
Lymphs Abs: 4694 cells/uL — ABNORMAL HIGH (ref 850–3900)
MCH: 27 pg (ref 27.0–33.0)
MCHC: 33.2 g/dL (ref 32.0–36.0)
MCV: 81.2 fL (ref 80.0–100.0)
MONOS PCT: 6.6 %
MPV: 10.5 fL (ref 7.5–12.5)
NEUTROS ABS: 4736 {cells}/uL (ref 1500–7800)
NEUTROS PCT: 45.1 %
PLATELETS: 311 10*3/uL (ref 140–400)
RBC: 4.78 10*6/uL (ref 3.80–5.10)
RDW: 13 % (ref 11.0–15.0)
TOTAL LYMPHOCYTE: 44.7 %
WBC mixed population: 693 cells/uL (ref 200–950)
WBC: 10.5 10*3/uL (ref 3.8–10.8)

## 2017-09-05 LAB — HEMOGLOBIN A1C
HEMOGLOBIN A1C: 5.6 %{Hb} (ref <5.7)
Mean Plasma Glucose: 114 (calc)
eAG (mmol/L): 6.3 (calc)

## 2017-09-05 LAB — TSH: TSH: 2.05 m[IU]/L (ref 0.40–4.50)

## 2017-09-05 LAB — VITAMIN B12: VITAMIN B 12: 352 pg/mL (ref 200–1100)

## 2017-09-05 LAB — MICROALBUMIN / CREATININE URINE RATIO
Creatinine, Urine: 49 mg/dL (ref 20–320)
MICROALB/CREAT RATIO: 10 ug/mg{creat} (ref <30)
Microalb, Ur: 0.5 mg/dL

## 2017-09-05 LAB — VITAMIN D 25 HYDROXY (VIT D DEFICIENCY, FRACTURES): VIT D 25 HYDROXY: 33 ng/mL (ref 30–100)

## 2017-09-05 LAB — MAGNESIUM: Magnesium: 2 mg/dL (ref 1.5–2.5)

## 2017-09-05 LAB — NO CULTURE INDICATED

## 2017-09-15 NOTE — Progress Notes (Signed)
LVM for pt to return office call for LAB results.

## 2017-10-17 ENCOUNTER — Other Ambulatory Visit: Payer: BLUE CROSS/BLUE SHIELD

## 2017-10-17 DIAGNOSIS — I10 Essential (primary) hypertension: Secondary | ICD-10-CM

## 2017-10-17 LAB — BASIC METABOLIC PANEL WITH GFR
BUN: 15 mg/dL (ref 7–25)
CO2: 31 mmol/L (ref 20–32)
Calcium: 9.2 mg/dL (ref 8.6–10.4)
Chloride: 99 mmol/L (ref 98–110)
Creat: 0.85 mg/dL (ref 0.50–0.99)
GFR, EST NON AFRICAN AMERICAN: 74 mL/min/{1.73_m2} (ref >=60)
GFR, Est African American: 86 mL/min/{1.73_m2} (ref >=60)
GLUCOSE: 87 mg/dL (ref 65–99)
POTASSIUM: 3.6 mmol/L (ref 3.5–5.3)
SODIUM: 139 mmol/L (ref 135–146)

## 2017-10-20 NOTE — Progress Notes (Signed)
Pt aware of lab results & voiced understanding of those results.

## 2017-10-21 ENCOUNTER — Other Ambulatory Visit: Payer: Self-pay | Admitting: Physician Assistant

## 2017-10-21 DIAGNOSIS — Z1231 Encounter for screening mammogram for malignant neoplasm of breast: Secondary | ICD-10-CM

## 2017-11-06 ENCOUNTER — Ambulatory Visit
Admission: RE | Admit: 2017-11-06 | Discharge: 2017-11-06 | Disposition: A | Discharge status: Home / Self Care | Payer: BLUE CROSS/BLUE SHIELD | Source: Ambulatory Visit | Attending: Physician Assistant | Admitting: Physician Assistant

## 2017-11-06 DIAGNOSIS — Z1231 Encounter for screening mammogram for malignant neoplasm of breast: Secondary | ICD-10-CM

## 2017-11-07 ENCOUNTER — Ambulatory Visit: Payer: BLUE CROSS/BLUE SHIELD

## 2017-11-19 ENCOUNTER — Other Ambulatory Visit: Payer: Self-pay | Admitting: Internal Medicine

## 2017-11-20 ENCOUNTER — Other Ambulatory Visit: Payer: Self-pay | Admitting: Internal Medicine

## 2017-12-09 ENCOUNTER — Ambulatory Visit: Payer: Self-pay | Admitting: Physician Assistant

## 2018-06-18 ENCOUNTER — Ambulatory Visit: Payer: BLUE CROSS/BLUE SHIELD | Admitting: Adult Health

## 2018-06-18 ENCOUNTER — Encounter: Payer: Self-pay | Admitting: Adult Health

## 2018-06-18 VITALS — BP 130/76 | HR 76 | Temp 97.3°F | Resp 16 | Wt 230.0 lb

## 2018-06-18 DIAGNOSIS — J209 Acute bronchitis, unspecified: Secondary | ICD-10-CM

## 2018-06-18 MED ORDER — PREDNISONE 20 MG PO TABS: ORAL_TABLET | ORAL | 0 refills | Status: AC

## 2018-06-18 MED ORDER — AZITHROMYCIN 250 MG PO TABS: ORAL_TABLET | ORAL | 1 refills | Status: AC

## 2018-06-18 MED ORDER — PROMETHAZINE-DM 6.25-15 MG/5ML PO SYRP: 5.0000 mL | ORAL_SOLUTION | Freq: Four times a day (QID) | ORAL | 1 refills | Status: AC | PRN

## 2018-06-18 NOTE — Patient Instructions (Signed)

## 2018-06-18 NOTE — Progress Notes (Signed)
Assessment and Plan:  Darel HongJudy was seen today for acute visit, cough, chest pain and headache.  Diagnoses and all orders for this visit:  Acute bronchitis, unspecified organism Stop amoxicillin, will switch to azithromycin Suggested symptomatic OTC remedies. Nasal saline spray for congestion. Nasal steroids, oral steroids Follow up as needed, present to ER for severe symptoms over the weekend -     promethazine-dextromethorphan (PROMETHAZINE-DM) 6.25-15 MG/5ML syrup; Take 5 mLs by mouth 4 (four) times daily as needed for cough. -     azithromycin (ZITHROMAX) 250 MG tablet; Take 2 tablets (500 mg) on  Day 1,  followed by 1 tablet (250 mg) once daily on Days 2 through 5. -     predniSONE (DELTASONE) 20 MG tablet; 2 tablets daily for 3 days, 1 tablet daily for 4 days.  Further disposition pending results of labs. Discussed med's effects and SE's.   Over 15 minutes of exam, counseling, chart review, and critical decision making was performed.   Future Appointments  Date Time Provider Department Center  06/18/2018  2:30 PM Judd Gaudierorbett, Lauralynn Loeb, NP [facility] None  09/09/2018  2:00 PM Quentin Mullingollier, Amanda, PA-C [facility] None    ------------------------------------------------------------------------------------------------------------------   HPI BP 130/76   Pulse 76   Temp (!) 97.3 F (36.3 C)   Resp 16   Wt 230 lb (104.3 kg)   SpO2 99%   BMI 33.97 kg/m   61 y.o.female presents for evaluation of ongoing hoarseness; she reports URI symptoms ongoing x 1 week. Began as sore throat, progressed to hoarseness, started having a productive cough (thick/green). She [facility] this past Sunday and was treated by amoxicillin, prednisone (pharmacy only had 2 day stock) and codeine cough syrup. She reports hoarseness, bilateral ear pressure, chest pains, headache, fatigued. She reports sensation of feeling warm/chilled at home without measured fever. She reports cough and hoarseness has improved, but  still "feeling bad." She reports has been constipated since starting codeine cough syrup.   Has not taken ibuprofen/tylenol for pain.  Denies hx of seasonal allergies.  Past Medical History:  Diagnosis Date  . Depression   . Hypertension   . Kidney stones 11-2011  . Obesity   . Shingles    chest-right  . Vitamin D deficiency      No Known Allergies  Current Outpatient Medications on File Prior to Visit  Medication Sig  . atenolol (TENORMIN) 50 MG tablet TAKE 1 TABLET BY MOUTH EVERY DAY  . hydrochlorothiazide (HYDRODIURIL) 25 MG tablet TAKE 1 TABLET (25 MG TOTAL) BY MOUTH DAILY.   No current facility-administered medications on file prior to visit.     ROS: all negative except above.   Physical Exam:  BP 130/76   Pulse 76   Temp (!) 97.3 F (36.3 C)   Resp 16   Wt 230 lb (104.3 kg)   SpO2 99%   BMI 33.97 kg/m   General Appearance: Well nourished, in no apparent distress. Eyes: PERRLA, EOMs, conjunctiva no swelling or erythema Sinuses: Generalized Frontal/maxillary fullness without tenderness ENT/Mouth: Ext aud canals clear, TMs without erythema, bulging. Pharynx mildly erythematous, without swelling, or exudate on post pharynx.  Tonsils not swollen or erythematous. Hearing normal.  Neck: Supple, thyroid normal.  Respiratory: Respiratory effort normal, BS equal bilaterally with scant scattered rhonchi without rales, wheezing or stridor.  Cardio: RRR with no MRGs. Brisk peripheral pulses without edema.  Abdomen: Soft, + BS.  Non tender, no guarding, rebound, hernias, masses. Lymphatics: Non tender without lymphadenopathy.  Musculoskeletal: Symmetrical  strength, normal gait.  Skin: Warm, dry without rashes, lesions, ecchymosis.  Neuro: Cranial nerves intact. Normal muscle tone, no cerebellar symptoms. Sensation intact.  Psych: Awake and oriented X 3, normal affect, Insight and Judgment appropriate.     Dan Maker, NP 2:17 PM [facility]  [facility]

## 2018-06-28 ENCOUNTER — Other Ambulatory Visit: Payer: Self-pay | Admitting: Internal Medicine

## 2018-09-06 ENCOUNTER — Other Ambulatory Visit: Payer: Self-pay | Admitting: Internal Medicine

## 2018-09-09 ENCOUNTER — Encounter: Payer: Self-pay | Admitting: Physician Assistant

## 2018-09-30 ENCOUNTER — Other Ambulatory Visit: Payer: Self-pay | Admitting: Internal Medicine

## 2018-10-28 ENCOUNTER — Other Ambulatory Visit: Payer: Self-pay | Admitting: Internal Medicine

## 2019-09-14 ENCOUNTER — Encounter: Payer: Self-pay | Admitting: Physician Assistant

## 2019-10-21 ENCOUNTER — Other Ambulatory Visit: Payer: Self-pay | Admitting: Physician Assistant
